# Patient Record
Sex: Female | Born: 1935 | Race: White | Hispanic: No | Smoking: Former smoker
Health system: Southern US, Community
[De-identification: ages and names within clinical notes are randomized; demographics above are authoritative.]

## PROBLEM LIST (undated history)

## (undated) ENCOUNTER — Emergency Department (HOSPITAL_COMMUNITY): Payer: Medicare Other

## (undated) DIAGNOSIS — E049 Nontoxic goiter, unspecified: Secondary | ICD-10-CM

## (undated) DIAGNOSIS — E039 Hypothyroidism, unspecified: Secondary | ICD-10-CM

## (undated) DIAGNOSIS — M810 Age-related osteoporosis without current pathological fracture: Secondary | ICD-10-CM

## (undated) DIAGNOSIS — I459 Conduction disorder, unspecified: Secondary | ICD-10-CM

## (undated) HISTORY — DX: Age-related osteoporosis without current pathological fracture: M81.0

## (undated) HISTORY — DX: Nontoxic goiter, unspecified: E04.9

## (undated) HISTORY — DX: Conduction disorder, unspecified: I45.9

## (undated) HISTORY — PX: THYROID SURGERY: SHX805

## (undated) HISTORY — DX: Hypothyroidism, unspecified: E03.9

---

## 2013-01-16 ENCOUNTER — Ambulatory Visit (HOSPITAL_COMMUNITY)
Admission: RE | Admit: 2013-01-16 | Discharge: 2013-01-16 | Disposition: A | Discharge status: Home / Self Care | Payer: Medicare Other | Source: Ambulatory Visit | Attending: Neurology | Admitting: Neurology

## 2013-01-16 ENCOUNTER — Other Ambulatory Visit: Payer: Self-pay | Admitting: Neurology

## 2013-01-16 DIAGNOSIS — M545 Low back pain, unspecified: Secondary | ICD-10-CM | POA: Insufficient documentation

## 2013-01-16 DIAGNOSIS — M79609 Pain in unspecified limb: Secondary | ICD-10-CM | POA: Insufficient documentation

## 2013-01-16 DIAGNOSIS — M5416 Radiculopathy, lumbar region: Secondary | ICD-10-CM

## 2013-01-16 DIAGNOSIS — M4120 Other idiopathic scoliosis, site unspecified: Secondary | ICD-10-CM | POA: Insufficient documentation

## 2013-04-25 ENCOUNTER — Other Ambulatory Visit (HOSPITAL_COMMUNITY): Payer: Self-pay | Admitting: Urology

## 2013-04-25 DIAGNOSIS — R3129 Other microscopic hematuria: Secondary | ICD-10-CM

## 2013-05-01 ENCOUNTER — Ambulatory Visit (HOSPITAL_COMMUNITY)
Admission: RE | Admit: 2013-05-01 | Discharge: 2013-05-01 | Disposition: A | Discharge status: Home / Self Care | Payer: Medicare Other | Source: Ambulatory Visit | Attending: Urology | Admitting: Urology

## 2013-05-01 DIAGNOSIS — N200 Calculus of kidney: Secondary | ICD-10-CM | POA: Insufficient documentation

## 2013-05-01 DIAGNOSIS — R3129 Other microscopic hematuria: Secondary | ICD-10-CM | POA: Insufficient documentation

## 2013-05-01 DIAGNOSIS — R9389 Abnormal findings on diagnostic imaging of other specified body structures: Secondary | ICD-10-CM | POA: Insufficient documentation

## 2013-05-01 LAB — POCT I-STAT, CHEM 8
Glucose, Bld: 77 mg/dL (ref 70–99)
HCT: 40 % (ref 36.0–46.0)
Hemoglobin: 13.6 g/dL (ref 12.0–15.0)
Potassium: 3.6 mEq/L (ref 3.5–5.1)
Sodium: 140 mEq/L (ref 135–145)

## 2013-05-01 MED ORDER — IOHEXOL 300 MG/ML  SOLN
125.0000 mL | Freq: Once | INTRAMUSCULAR | Status: AC | PRN
  Administered 2013-05-01: 125 mL via INTRAVENOUS

## 2013-05-01 NOTE — Progress Notes (Signed)
Blood sample obtained from left arm IV for Creatnine level.  

## 2014-05-31 DIAGNOSIS — K219 Gastro-esophageal reflux disease without esophagitis: Secondary | ICD-10-CM | POA: Insufficient documentation

## 2014-05-31 DIAGNOSIS — R079 Chest pain, unspecified: Secondary | ICD-10-CM | POA: Insufficient documentation

## 2014-05-31 DIAGNOSIS — E876 Hypokalemia: Secondary | ICD-10-CM | POA: Insufficient documentation

## 2014-09-25 ENCOUNTER — Ambulatory Visit (HOSPITAL_COMMUNITY)
Admission: RE | Admit: 2014-09-25 | Discharge: 2014-09-25 | Disposition: A | Discharge status: Home / Self Care | Payer: Medicare HMO | Source: Ambulatory Visit | Attending: Urology | Admitting: Urology

## 2014-09-25 ENCOUNTER — Other Ambulatory Visit (HOSPITAL_COMMUNITY): Payer: Self-pay | Admitting: Urology

## 2014-09-25 DIAGNOSIS — R3129 Other microscopic hematuria: Secondary | ICD-10-CM

## 2014-09-25 DIAGNOSIS — M549 Dorsalgia, unspecified: Secondary | ICD-10-CM | POA: Insufficient documentation

## 2014-09-25 DIAGNOSIS — R109 Unspecified abdominal pain: Secondary | ICD-10-CM | POA: Diagnosis present

## 2014-09-25 DIAGNOSIS — R312 Other microscopic hematuria: Secondary | ICD-10-CM | POA: Insufficient documentation

## 2014-09-25 DIAGNOSIS — N200 Calculus of kidney: Secondary | ICD-10-CM

## 2015-09-01 ENCOUNTER — Ambulatory Visit (INDEPENDENT_AMBULATORY_CARE_PROVIDER_SITE_OTHER): Payer: Medicare Other | Admitting: "Endocrinology

## 2015-09-01 ENCOUNTER — Encounter: Payer: Self-pay | Admitting: "Endocrinology

## 2015-09-01 VITALS — BP 148/76 | HR 67 | Ht <= 58 in | Wt 95.0 lb

## 2015-09-01 DIAGNOSIS — E039 Hypothyroidism, unspecified: Secondary | ICD-10-CM

## 2015-09-01 MED ORDER — LEVOTHYROXINE SODIUM 75 MCG PO TABS: 75.0000 ug | ORAL_TABLET | Freq: Every day | ORAL | Status: DC

## 2015-09-01 NOTE — Progress Notes (Signed)
Subjective:    Patient ID: Laurie Humphrey, female    DOB: [birth year],    Past Medical History  Diagnosis Date  . Goiter, non-toxic   . Osteoporosis   . Hypothyroidism    Past Surgical History  Procedure Laterality Date  . Thyroid surgery     Social History   Social History  . Marital Status: Widowed    Spouse Name: N/A  . Number of Children: N/A  . Years of Education: N/A   Social History Main Topics  . Smoking status: Current Some Day Smoker  . Smokeless tobacco: None  . Alcohol Use: None  . Drug Use: None  . Sexual Activity: Not Asked   Other Topics Concern  . None   Social History Narrative  . None   Outpatient Encounter Prescriptions as of 09/01/2015  Medication Sig  . ALPRAZolam (XANAX) 0.5 MG tablet Take 0.5 mg by mouth at bedtime as needed for anxiety.  Marland Kitchen levothyroxine (SYNTHROID, LEVOTHROID) 75 MCG tablet Take 1 tablet (75 mcg total) by mouth daily before breakfast.  . omeprazole (PRILOSEC) 20 MG capsule Take 20 mg by mouth daily.  . [DISCONTINUED] levothyroxine (SYNTHROID, LEVOTHROID) 75 MCG tablet Take 75 mcg by mouth daily before breakfast.   No facility-administered encounter medications on file as of 09/01/2015.   ALLERGIES: Allergies  Allergen Reactions  . Sulfa Antibiotics    VACCINATION STATUS:  There is no immunization history on file for this patient.  HPI  Laurie Humphrey is 81-yr-old female with medical hx as above. she is here to f/u with repeat TFTs. Her thyroud u/s report from [facility] reviwed.  Her hx starts from 1970 when she underwent thyroidectomy for a "benign disease". she took thyroid hormone at various doses ever since , currently at 75 mcg po qam. she denies new complaints.  she is compliant to her medications. she said that when she was tried on 50 mcg of synthroid, she "could not move her legs'. she has steady weight. denies heat /cold intolerance.  Review of Systems  Constitutional: Positive for fatigue.  Negative for unexpected weight change.  HENT: Negative for trouble swallowing and voice change.   Eyes: Negative for visual disturbance.  Respiratory: Negative for cough, shortness of breath and wheezing.   Cardiovascular: Negative for chest pain, palpitations and leg swelling.  Gastrointestinal: Negative for nausea, vomiting and diarrhea.  Endocrine: Negative for cold intolerance, heat intolerance, polydipsia, polyphagia and polyuria.  Musculoskeletal: Negative for myalgias and arthralgias.  Skin: Negative for color change, pallor, rash and wound.  Neurological: Negative for seizures and headaches.  Psychiatric/Behavioral: Negative for suicidal ideas and confusion.    Objective:    BP 148/76 mmHg  Pulse 67  Ht  (1.473 m)  Wt 95 lb (43.092 kg)  BMI 19.86 kg/m2  SpO2 99%  Wt Readings from Last 3 Encounters:  09/01/15 95 lb (43.092 kg)    Physical Exam  Constitutional: She is oriented to person, place, and time. She appears well-developed.  HENT:  Head: Normocephalic and atraumatic.  Eyes: EOM are normal.  Neck: Normal range of motion. Neck supple. No tracheal deviation present. No thyromegaly present.  Cardiovascular: Normal rate and regular rhythm.   Pulmonary/Chest: Effort normal and breath sounds normal.  Abdominal: Soft. Bowel sounds are normal. There is no tenderness. There is no guarding.  Musculoskeletal: Normal range of motion. She exhibits no edema.  Neurological: She is alert and oriented to person, place, and time. She has normal reflexes. No  cranial nerve deficit. Coordination normal.  Hard of hearing  Skin: Skin is warm and dry. No rash noted. No erythema. No pallor.  Psychiatric: She has a normal mood and affect. Judgment normal.    Results for orders placed or performed during the hospital encounter of 05/01/13  I-STAT, chem 8  Result Value Ref Range   Sodium 140 135 - 145 mEq/L   Potassium 3.6 3.5 - 5.1 mEq/L   Chloride 105 96 - 112 mEq/L   BUN 16 6  - 23 mg/dL   Creatinine, Ser 1.61 0.50 - 1.10 mg/dL   Glucose, Bld 77 70 - 99 mg/dL   Calcium, Ion 0.96 0.45 - 1.30 mmol/L   TCO2 28 0 - 100 mmol/L   Hemoglobin 13.6 12.0 - 15.0 g/dL   HCT 40.9 81.1 - 91.4 %   Complete Blood Count (Most recent): Lab Results  Component Value Date   HGB 13.6 05/01/2013   HCT 40.0 05/01/2013   Chemistry (most recent): Lab Results  Component Value Date   NA 140 05/01/2013   K 3.6 05/01/2013   CL 105 05/01/2013   BUN 16 05/01/2013   CREATININE 1.10 05/01/2013   Diabetic Labs (most recent): No results found for: HGBA1C Lipid profile (most recent): No results found for: TRIG, CHOL       Assessment & Plan:   1. Hypothyroidism, unspecified hypothyroidism type - She is euthyroid. Her free t4 is high normal at 1.8 along with TSH of 0.35 slightly suppressed. -these  TFTs are suggestive of a slight over-replacement. -However, given the fact that she did not do well on lower doses of thyroid hormone and the upcoming cold season I decided to keep her on her current dose of Synthroid 75 mcg po qam for now. Dose adjustment will be made based on her next thyroid function tests. -her thyroid ultrasound report records from [facility] showing remnants of thyroid in the bed: rt lobe 1.4 x 0.6 cms x 0.5 cms and left lobe 1.5 cms x 1cms x 1cm. no nodules nor masses. she will not need any further work up nor intervention for this.  she will RTN in 6 months with TFTs for reevaluation. - T4, free - TSH   I advised patient to maintain close follow up with their PCP for primary care needs. Follow up plan: Return in about 6 months (around 03/01/2016) for underactive thyroid.  Marquis Lunch, MD Phone: [phone]  Fax: [phone]   09/01/2015, 1:57 PM

## 2016-02-23 ENCOUNTER — Other Ambulatory Visit: Payer: Self-pay | Admitting: "Endocrinology

## 2016-02-23 LAB — TSH: TSH: 0.98 m[IU]/L

## 2016-02-23 LAB — T4, FREE: FREE T4: 1.5 ng/dL (ref 0.8–1.8)

## 2016-03-01 ENCOUNTER — Encounter: Payer: Self-pay | Admitting: "Endocrinology

## 2016-03-01 ENCOUNTER — Ambulatory Visit (INDEPENDENT_AMBULATORY_CARE_PROVIDER_SITE_OTHER): Payer: Medicare Other | Admitting: "Endocrinology

## 2016-03-01 VITALS — BP 125/80 | HR 84 | Ht <= 58 in | Wt 91.0 lb

## 2016-03-01 DIAGNOSIS — E039 Hypothyroidism, unspecified: Secondary | ICD-10-CM

## 2016-03-01 MED ORDER — LEVOTHYROXINE SODIUM 75 MCG PO TABS: 75.0000 ug | ORAL_TABLET | Freq: Every day | ORAL | Status: AC

## 2016-03-01 NOTE — Progress Notes (Signed)
Subjective:    Patient ID: Laurie Humphrey, female    DOB: [birth year],    Past Medical History  Diagnosis Date  . Goiter, non-toxic   . Osteoporosis   . Hypothyroidism    Past Surgical History  Procedure Laterality Date  . Thyroid surgery     Social History   Social History  . Marital Status: Widowed    Spouse Name: N/A  . Number of Children: N/A  . Years of Education: N/A   Social History Main Topics  . Smoking status: Current Some Day Smoker  . Smokeless tobacco: None  . Alcohol Use: None  . Drug Use: None  . Sexual Activity: Not Asked   Other Topics Concern  . None   Social History Narrative   Outpatient Encounter Prescriptions as of 03/01/2016  Medication Sig  . ALPRAZolam (XANAX) 0.5 MG tablet Take 0.5 mg by mouth at bedtime as needed for anxiety.  Marland Kitchen levothyroxine (SYNTHROID, LEVOTHROID) 75 MCG tablet Take 1 tablet (75 mcg total) by mouth daily before breakfast.  . omeprazole (PRILOSEC) 20 MG capsule Take 20 mg by mouth daily.  . [DISCONTINUED] levothyroxine (SYNTHROID, LEVOTHROID) 75 MCG tablet Take 1 tablet (75 mcg total) by mouth daily before breakfast.   No facility-administered encounter medications on file as of 03/01/2016.   ALLERGIES: Allergies  Allergen Reactions  . Sulfa Antibiotics    VACCINATION STATUS:  There is no immunization history on file for this patient.  HPI  Laurie Humphrey is 82-yr-old female with medical hx as above. she is here to f/u with repeat TFTs. Her thyroud u/s report from [facility] reviwed.  Her hx starts from 1970 when she underwent thyroidectomy for a "benign disease". she took thyroid hormone at various doses ever since , currently at 75 mcg po qam. she denies new complaints.  she is compliant to her medications. she said that when she was tried on 50 mcg of synthroid, she "could not move her legs'. she has  Lost some  Weight, mainly due to the fact that she does not eat enough food because she states she can't  taste her food anymore. denies heat /cold intolerance.  Review of Systems  Constitutional: Positive for fatigue. Negative for unexpected weight change.  HENT: Negative for trouble swallowing and voice change.   Eyes: Negative for visual disturbance.  Respiratory: Negative for cough, shortness of breath and wheezing.   Cardiovascular: Negative for chest pain, palpitations and leg swelling.  Gastrointestinal: Negative for nausea, vomiting and diarrhea.  Endocrine: Negative for cold intolerance, heat intolerance, polydipsia, polyphagia and polyuria.  Musculoskeletal: Negative for myalgias and arthralgias.  Skin: Negative for color change, pallor, rash and wound.  Neurological: Negative for seizures and headaches.  Psychiatric/Behavioral: Negative for suicidal ideas and confusion.    Objective:    BP 125/80 mmHg  Pulse 84  Ht  (1.473 m)  Wt 91 lb (41.277 kg)  BMI 19.02 kg/m2  SpO2 98%  Wt Readings from Last 3 Encounters:  03/01/16 91 lb (41.277 kg)  09/01/15 95 lb (43.092 kg)    Physical Exam  Constitutional: She is oriented to person, place, and time. She appears well-developed.  HENT:  Head: Normocephalic and atraumatic.  Eyes: EOM are normal.  Neck: Normal range of motion. Neck supple. No tracheal deviation present. No thyromegaly present.  Cardiovascular: Normal rate and regular rhythm.   Pulmonary/Chest: Effort normal and breath sounds normal.  Abdominal: Soft. Bowel sounds are normal. There is no tenderness. There  is no guarding.  Musculoskeletal: Normal range of motion. She exhibits no edema.  Neurological: She is alert and oriented to person, place, and time. She has normal reflexes. No cranial nerve deficit. Coordination normal.  Hard of hearing  Skin: Skin is warm and dry. No rash noted. No erythema. No pallor.  Psychiatric: She has a normal mood and affect. Judgment normal.    Results for orders placed or performed in visit on 02/23/16  TSH  Result Value Ref  Range   TSH 0.98 mIU/L  T4, free  Result Value Ref Range   Free T4 1.5 0.8 - 1.8 ng/dL   Complete Blood Count (Most recent): Lab Results  Component Value Date   HGB 13.6 05/01/2013   HCT 40.0 05/01/2013   Chemistry (most recent): Lab Results  Component Value Date   NA 140 05/01/2013   K 3.6 05/01/2013   CL 105 05/01/2013   BUN 16 05/01/2013   CREATININE 1.10 05/01/2013     Results for Laurie MerryCARTER, Kayloni M (MRN [id number]) as of 03/01/2016 11:06  Ref. Range 02/23/2016 11:59  TSH Latest Units: mIU/L 0.98  T4,Free(Direct) Latest Ref Range: 0.8-1.8 ng/dL 1.5    Assessment & Plan:   1. Hypothyroidism, unspecified hypothyroidism type - Heart thyroid function tests are consistent with appropriate replacement for now.  -I will continue levothyroxine 75 g by mouth every morning.  - We discussed about correct intake of levothyroxine, at fasting, with water, separated by at least 30 minutes from breakfast, and separated by more than 4 hours from calcium, iron, multivitamins, acid reflux medications (PPIs). -Patient is made aware of the fact that thyroid hormone replacement is needed for life, dose to be adjusted by periodic monitoring of thyroid function tests.  -her thyroid ultrasound report records [facility] showing remnants of thyroid in the bed: rt lobe 1.4 x 0.6 cms x 0.5 cms and left lobe 1.5 cms x 1cms x 1cm. no nodules nor masses. she will not need any further work up nor intervention for this. - Due to unintended weight loss mainly from undernourishment, I have encouraged her to eat more carbs of her choice. She says she likes potatoes and I encouraged her to eat more potatoes and less lemonade and sugary beverages.  she will RTN in 6 months with TFTs for reevaluation.  I advised patient to maintain close follow up with their PCP for primary care needs. Follow up plan: Return in about 6 months (around 08/31/2016) for underactive thyroid, follow up with pre-visit  labs.  Marquis LunchGebre Madalee Altmann, MD Phone: [phone]  Fax: [phone]   03/01/2016, 11:12 AM

## 2016-07-23 HISTORY — PX: PACEMAKER IMPLANT: EP1218

## 2016-08-11 ENCOUNTER — Ambulatory Visit: Payer: Medicare HMO | Admitting: Cardiology

## 2016-08-11 ENCOUNTER — Ambulatory Visit: Payer: Medicare HMO | Admitting: Cardiovascular Disease

## 2016-08-13 DIAGNOSIS — I442 Atrioventricular block, complete: Secondary | ICD-10-CM | POA: Insufficient documentation

## 2016-09-01 ENCOUNTER — Ambulatory Visit: Payer: 59 | Admitting: "Endocrinology

## 2017-05-27 ENCOUNTER — Ambulatory Visit (INDEPENDENT_AMBULATORY_CARE_PROVIDER_SITE_OTHER): Payer: Medicare Other | Admitting: Gastroenterology

## 2017-05-27 ENCOUNTER — Encounter: Payer: Self-pay | Admitting: Gastroenterology

## 2017-05-27 ENCOUNTER — Other Ambulatory Visit: Payer: Self-pay

## 2017-05-27 DIAGNOSIS — R1012 Left upper quadrant pain: Secondary | ICD-10-CM | POA: Insufficient documentation

## 2017-05-27 MED ORDER — SUCRALFATE 1 GM/10ML PO SUSP: 1.0000 g | Freq: Four times a day (QID) | ORAL | 1 refills | Status: DC

## 2017-05-27 NOTE — Progress Notes (Signed)
Primary Care Physician:  Cathren Laine, MD Primary Gastroenterologist:  Dr. Oneida Alar   Chief Complaint  Patient presents with  . Abdominal Pain    left side-US done; had been hurting in lower abd  . Gastroesophageal Reflux    referral was for gerd?; ok now    HPI:   Laurie Humphrey is a 83 y.o. female presenting today at the request of her PCP with abdominal pain. Referral information notes reason for consult "GERD". Outside labs from May 2018 with Hgb 13.1, no leukocytosis, platelets 275, BUN 13, Cr 1.1, Tbili 0.6, Alk Phos 58, AST 19, ALT 13, Albumin 4.8, US abdomen from May 2018 with normal liver, gallbladder, CBD. It appears a CT was done, but I am unable to read the findings due to print quality, but it appears impression states "no acute findings".   Abdominal pain in left-side started a month ago. Some days no pain, some days "all the time". Yesterday so bad she was in tears. When walking, she has pain. No exacerbation of pain after eating. However, she does sometimes get nauseated after eating. No weight loss. Doesn't like to go without eating. Unsure if she has ever had black, tarry stool as "I don't ever look". No NSAIDs or aspirin powders. Some confusion regarding Norco, as PCP prescribed this but there was no improvement after taking. She has taken all of this as of today. No dysphagia. No constipation or diarrhea.   States when she was in her 89s, she was in the hospital for about 17 days for "colitis" and then when she went back it was "healed up" and no signs of colitis. Last colonoscopy in 2006 at [facility].   History of complete heart block, with pacemaker implanted Sept 2017.   Past Medical History:  Diagnosis Date  . Goiter, non-toxic   . Heart block    Pacemaker sept 2017  . Hypothyroidism   . Osteoporosis     Past Surgical History:  Procedure Laterality Date  . PACEMAKER IMPLANT  07/2016   [facility]  . THYROID SURGERY      Current Outpatient Prescriptions    Medication Sig Dispense Refill  . ALPRAZolam (XANAX) 0.5 MG tablet Take 0.5 mg by mouth at bedtime as needed for anxiety.    Marland Kitchen levothyroxine (SYNTHROID, LEVOTHROID) 75 MCG tablet Take 1 tablet (75 mcg total) by mouth daily before breakfast. 30 tablet 6  . omeprazole (PRILOSEC) 20 MG capsule Take 20 mg by mouth daily.     No current facility-administered medications for this visit.     Allergies as of 05/27/2017 - Review Complete 05/27/2017  Allergen Reaction Noted  . Sulfa antibiotics  09/01/2015    Family History  Problem Relation Age of Onset  . Diabetes Mother   . Colon polyps Daughter   . Colon cancer Neg Hx     Social History   Social History  . Marital status: Widowed    Spouse name: N/A  . Number of children: N/A  . Years of education: N/A   Occupational History  . Not on file.   Social History Main Topics  . Smoking status: Former Research scientist (life sciences)  . Smokeless tobacco: Never Used  . Alcohol use No  . Drug use: No  . Sexual activity: Not on file   Other Topics Concern  . Not on file   Social History Narrative  . No narrative on file    Review of Systems: As mentioned in HPI   Physical Exam: BP Marland Kitchen)  166/85   Pulse 71   Temp (!) 96.7 F (35.9 C) (Oral)   Ht 4' 10"  (1.473 m)   Wt 93 lb 9.6 oz (42.5 kg)   BMI 19.56 kg/m  General:   Alert and oriented. Pleasant and cooperative. Well-nourished and well-developed.  Head:  Normocephalic and atraumatic. Eyes:  Without icterus, sclera clear and conjunctiva pink.  Ears:  Normal auditory acuity. Nose:  No deformity, discharge,  or lesions. Mouth:  No deformity or lesions, oral mucosa pink.  Lungs:  Clear to auscultation bilaterally. No wheezes, rales, or rhonchi. No distress.  Heart:  S1, S2 present without murmurs appreciated.  Abdomen:  +BS, soft, TTP below left rib margin, epigastric, and non-distended. No HSM noted. No guarding or rebound. No masses appreciated.  Rectal:  Deferred  Msk:  With kyphosis   Extremities:  Without  edema. Neurologic:  Alert and  oriented x4 Psych:  Alert and cooperative. Normal mood and affect.

## 2017-05-27 NOTE — Patient Instructions (Signed)
Continue Prilosec once each morning, 30 minutes before breakfast.  I sent in a suspension to take 4 times a day. If this is not helpful, you can stop taking after a week.   We have scheduled you for an upper endoscopy with Dr. Darrick PennaFields in the near future.

## 2017-05-28 NOTE — Assessment & Plan Note (Addendum)
83 year old with new onset of LUQ abdominal pain, vague in nature, denying NSAIDs or aspirin powders. US abdomen May 2018 by PCP without acute findings. CT completed and difficult to read due to print quality from fax, but I note it states "no acute findings". I have requested this again. CBC and LFTs unrevealing. No lower GI symptoms. Discussed endoscopic evaluation in near future, continuing Prilosec daily, and requesting CT findings again. Patient aware. Signs/symptoms that would necessitate urgent evaluation discussed. As of note, she has had no weight loss, symptoms do not seem worse or better with eating, and her presentation and symptoms do not seem consistent with an ischemic etiology.   Proceed with upper endoscopy in the near future with Dr. Darrick PennaFields. The risks, benefits, and alternatives have been discussed in detail with patient. They have stated understanding and desire to proceed.  As of note, patient has pacemaker, implanted Sept 2017

## 2017-05-29 NOTE — Progress Notes (Signed)
REVIEWED-NO ADDITIONAL RECOMMENDATIONS. 

## 2017-05-30 NOTE — Progress Notes (Signed)
CC'ED TO PCP 

## 2017-06-07 ENCOUNTER — Encounter (HOSPITAL_COMMUNITY)
Admission: RE | Disposition: A | Discharge status: Home / Self Care | Payer: Self-pay | Source: Ambulatory Visit | Attending: Gastroenterology

## 2017-06-07 ENCOUNTER — Encounter (HOSPITAL_COMMUNITY): Payer: Self-pay | Admitting: Gastroenterology

## 2017-06-07 ENCOUNTER — Ambulatory Visit (HOSPITAL_COMMUNITY)
Admission: RE | Admit: 2017-06-07 | Discharge: 2017-06-07 | Disposition: A | Discharge status: Home / Self Care | Payer: Medicare Other | Source: Ambulatory Visit | Attending: Gastroenterology | Admitting: Gastroenterology

## 2017-06-07 DIAGNOSIS — Z79899 Other long term (current) drug therapy: Secondary | ICD-10-CM | POA: Diagnosis not present

## 2017-06-07 DIAGNOSIS — Z882 Allergy status to sulfonamides status: Secondary | ICD-10-CM | POA: Diagnosis not present

## 2017-06-07 DIAGNOSIS — R1012 Left upper quadrant pain: Secondary | ICD-10-CM | POA: Diagnosis not present

## 2017-06-07 DIAGNOSIS — I459 Conduction disorder, unspecified: Secondary | ICD-10-CM | POA: Insufficient documentation

## 2017-06-07 DIAGNOSIS — K2950 Unspecified chronic gastritis without bleeding: Secondary | ICD-10-CM | POA: Diagnosis not present

## 2017-06-07 DIAGNOSIS — Z8371 Family history of colonic polyps: Secondary | ICD-10-CM | POA: Insufficient documentation

## 2017-06-07 DIAGNOSIS — K449 Diaphragmatic hernia without obstruction or gangrene: Secondary | ICD-10-CM | POA: Diagnosis not present

## 2017-06-07 DIAGNOSIS — Z950 Presence of cardiac pacemaker: Secondary | ICD-10-CM | POA: Diagnosis not present

## 2017-06-07 DIAGNOSIS — Z87891 Personal history of nicotine dependence: Secondary | ICD-10-CM | POA: Insufficient documentation

## 2017-06-07 DIAGNOSIS — R1013 Epigastric pain: Secondary | ICD-10-CM

## 2017-06-07 DIAGNOSIS — K2970 Gastritis, unspecified, without bleeding: Secondary | ICD-10-CM

## 2017-06-07 DIAGNOSIS — E039 Hypothyroidism, unspecified: Secondary | ICD-10-CM | POA: Insufficient documentation

## 2017-06-07 DIAGNOSIS — M810 Age-related osteoporosis without current pathological fracture: Secondary | ICD-10-CM | POA: Diagnosis not present

## 2017-06-07 HISTORY — PX: ESOPHAGOGASTRODUODENOSCOPY: SHX5428

## 2017-06-07 HISTORY — PX: BIOPSY: SHX5522

## 2017-06-07 SURGERY — EGD (ESOPHAGOGASTRODUODENOSCOPY)
Anesthesia: Moderate Sedation

## 2017-06-07 MED ORDER — MEPERIDINE HCL 100 MG/ML IJ SOLN
INTRAMUSCULAR | Status: AC
  Filled 2017-06-07: qty 2

## 2017-06-07 MED ORDER — MIDAZOLAM HCL 5 MG/5ML IJ SOLN
INTRAMUSCULAR | Status: DC | PRN
  Administered 2017-06-07: 2 mg via INTRAVENOUS

## 2017-06-07 MED ORDER — LIDOCAINE VISCOUS 2 % MT SOLN
OROMUCOSAL | Status: AC
  Filled 2017-06-07: qty 15

## 2017-06-07 MED ORDER — SIMETHICONE 40 MG/0.6ML PO SUSP
ORAL | Status: DC | PRN
  Administered 2017-06-07: 14:00:00

## 2017-06-07 MED ORDER — MIDAZOLAM HCL 5 MG/5ML IJ SOLN
INTRAMUSCULAR | Status: AC
  Filled 2017-06-07: qty 10

## 2017-06-07 MED ORDER — SODIUM CHLORIDE 0.9 % IV SOLN
INTRAVENOUS | Status: DC
  Administered 2017-06-07: 14:00:00 via INTRAVENOUS

## 2017-06-07 MED ORDER — MEPERIDINE HCL 100 MG/ML IJ SOLN
INTRAMUSCULAR | Status: DC | PRN
  Administered 2017-06-07: 25 mg via INTRAVENOUS

## 2017-06-07 MED ORDER — LIDOCAINE VISCOUS 2 % MT SOLN
OROMUCOSAL | Status: DC | PRN
  Administered 2017-06-07: 3 mL via OROMUCOSAL

## 2017-06-07 NOTE — H&P (Signed)
  Primary Care Physician:  Lindajo RoyalSanders, Kirk, MD Primary Gastroenterologist:  Dr. Darrick PennaFields  Pre-Procedure History & Physical: HPI:  Laurie Humphrey is a 83 y.o. female here for ABDOMINAL PAIN/DYSPEPSIA.  Past Medical History:  Diagnosis Date  . Goiter, non-toxic   . Heart block    Pacemaker sept 2017  . Hypothyroidism   . Osteoporosis     Past Surgical History:  Procedure Laterality Date  . PACEMAKER IMPLANT  07/2016   [facility]  . THYROID SURGERY      Prior to Admission medications   Medication Sig Start Date End Date Taking? Authorizing Provider  ALPRAZolam Prudy Feeler(XANAX) 0.5 MG tablet Take 0.5 mg by mouth at bedtime.    Yes [provider]  cholecalciferol (VITAMIN D) 1000 units tablet Take 1,000 Units by mouth daily.   Yes [provider]  folic acid (FOLVITE) 1 MG tablet Take 1 mg by mouth daily.   Yes [provider]  levothyroxine (SYNTHROID, LEVOTHROID) 75 MCG tablet Take 1 tablet (75 mcg total) by mouth daily before breakfast. 03/01/16  Yes Nida, Denman GeorgeGebreselassie W, MD  omeprazole (PRILOSEC) 20 MG capsule Take 20 mg by mouth daily.   Yes [provider]  vitamin B-12 (CYANOCOBALAMIN) 1000 MCG tablet Take 1,000 mcg by mouth daily.   Yes [provider]  sucralfate (CARAFATE) 1 GM/10ML suspension Take 10 mLs (1 g total) by mouth 4 (four) times daily. Patient not taking: Reported on 06/03/2017 05/27/17   Gelene MinkBoone, Anna W, NP    Allergies as of 05/27/2017 - Review Complete 05/27/2017  Allergen Reaction Noted  . Sulfa antibiotics  09/01/2015    Family History  Problem Relation Age of Onset  . Diabetes Mother   . Colon polyps Daughter   . Colon cancer Neg Hx     Social History   Social History  . Marital status: Widowed    Spouse name: N/A  . Number of children: N/A  . Years of education: N/A   Occupational History  . Not on file.   Social History Main Topics  . Smoking status: Former Games developermoker  . Smokeless tobacco: Never Used  . Alcohol  use No  . Drug use: No  . Sexual activity: Not on file   Other Topics Concern  . Not on file   Social History Narrative  . No narrative on file    Review of Systems: See HPI, otherwise negative ROS   Physical Exam: BP 125/69   Pulse 66   Temp 97.9 F (36.6 C) (Oral)   Resp 19   Ht 4\' 10"  (1.473 m)   Wt 93 lb (42.2 kg)   SpO2 100%   BMI 19.44 kg/m  General:   Alert,  pleasant and cooperative in NAD Head:  Normocephalic and atraumatic. Neck:  Supple; Lungs:  Clear throughout to auscultation.    Heart:  Regular rate and rhythm. Abdomen:  Soft, nontender and nondistended. Normal bowel sounds, without guarding, and without rebound.   Neurologic:  Alert and  oriented x4;  grossly normal neurologically.  Impression/Plan:     ABDOMINAL PAIN/DYSPEPSIA.  PLAN: 1. EGD TODAY. DISCUSSED PROCEDURE, BENEFITS, & RISKS: < 1% chance of medication reaction, bleeding, or perforation.

## 2017-06-07 NOTE — Discharge Instructions (Addendum)
You have mild gastritis. I biopsied your stomach.   TAKE OMEPRAZOLE 30 MINUTES PRIOR TO first MEAL DAILY.  YOUR BIOPSY RESULTS WILL BE AVAILABLE IN MY CHART AFTER JUL 20 AND MY OFFICE WILL CONTACT YOU IN 10-14 DAYS WITH YOUR RESULTS.   FOLLOW UP IN 2 MOS.  UPPER ENDOSCOPY AFTER CARE Read the instructions outlined below and refer to this sheet in the next week. These discharge instructions provide you with general information on caring for yourself after you leave the hospital. While your treatment has been planned according to the most current medical practices available, unavoidable complications occasionally occur. If you have any problems or questions after discharge, call DR. Alaia Lordi, [phone].  ACTIVITY  You may resume your regular activity, but move at a slower pace for the next 24 hours.   Take frequent rest periods for the next 24 hours.   Walking will help get rid of the air and reduce the bloated feeling in your belly (abdomen).   No driving for 24 hours (because of the medicine (anesthesia) used during the test).   You may shower.   Do not sign any important legal documents or operate any machinery for 24 hours (because of the anesthesia used during the test).    NUTRITION  Drink plenty of fluids.   You may resume your normal diet as instructed by your doctor.   Begin with a light meal and progress to your normal diet. Heavy or fried foods are harder to digest and may make you feel sick to your stomach (nauseated).   Avoid alcoholic beverages for 24 hours or as instructed.    MEDICATIONS  You may resume your normal medications.   WHAT YOU CAN EXPECT TODAY  Some feelings of bloating in the abdomen.   Passage of more gas than usual.    IF YOU HAD A BIOPSY TAKEN DURING THE UPPER ENDOSCOPY:  Eat a soft diet IF YOU HAVE NAUSEA, BLOATING, ABDOMINAL PAIN, OR VOMITING.    FINDING OUT THE RESULTS OF YOUR TEST Not all test results are available during  your visit. DR. Darrick PennaFIELDS WILL CALL YOU WITHIN 14 DAYS OF YOUR PROCEDUE WITH YOUR RESULTS. Do not assume everything is normal if you have not heard from DR. Freddie Nghiem, CALL HER OFFICE AT [phone].  SEEK IMMEDIATE MEDICAL ATTENTION AND CALL THE OFFICE: [phone] IF:  You have more than a spotting of blood in your stool.   Your belly is swollen (abdominal distention).   You are nauseated or vomiting.   You have a temperature over 101F.   You have abdominal pain or discomfort that is severe or gets worse throughout the day.   Gastritis  Gastritis is an inflammation (the body's way of reacting to injury and/or infection) of the stomach. It is often caused by viral or bacterial (germ) infections. It can also be caused BY ASPIRIN, BC/GOODY POWDER'S, (IBUPROFEN) MOTRIN, OR ALEVE (NAPROXEN), chemicals (including alcohol), SPICY FOODS, and medications. This illness may be associated with generalized malaise (feeling tired, not well), UPPER ABDOMINAL STOMACH cramps, and fever. One common bacterial cause of gastritis is an organism known as H. Pylori. This can be treated with antibiotics.

## 2017-06-07 NOTE — Op Note (Signed)
[facility] Patient Name: Laurie Humphrey Procedure Date: 06/07/2017 1:06 PM MRN: [id number] Date of Birth: 09-Apr-1929 Attending MD: Jonette Eva , MD CSN: [id number] Age: 83 Admit Type: Outpatient Procedure:                Upper GI endoscopy WITH COLD FORCEPS BIOPSY Indications:              Abdominal pain in the left upper quadrant,                            Dyspepsia. THINKS SHE HAS A KIDNEY STONE. LOST                            TASTE > 5 YRS AGO WEIGHT STABLE 93-95 LBS SINCE                            2016. Providers:                Jonette Eva, MD, Criselda Peaches. Patsy Lager, RN, Dyann Ruddle Referring MD:             Lindajo Royal, MD Medicines:                Meperidine 25 mg IV, Midazolam 2 mg IV Complications:            No immediate complications. Estimated Blood Loss:     Estimated blood loss was minimal. Procedure:                Pre-Anesthesia Assessment:                           - Prior to the procedure, a History and Physical                            was performed, and patient medications and                            allergies were reviewed. The patient's tolerance of                            previous anesthesia was also reviewed. The risks                            and benefits of the procedure and the sedation                            options and risks were discussed with the patient.                            All questions were answered, and informed consent                            was obtained. Prior Anticoagulants: The patient has  taken no previous anticoagulant or antiplatelet                            agents. ASA Grade Assessment: II - A patient with                            mild systemic disease. After reviewing the risks                            and benefits, the patient was deemed in                            satisfactory condition to undergo the procedure.                            After  obtaining informed consent, the endoscope was                            passed under direct vision. Throughout the                            procedure, the patient's blood pressure, pulse, and                            oxygen saturations were monitored continuously. The                            [id number] ([id number]) scope was introduced through the                            mouth, and advanced to the second part of duodenum.                            The upper GI endoscopy was accomplished without                            difficulty. The patient tolerated the procedure                            well. Scope In: 2:31:20 PM Scope Out: 2:37:56 PM Total Procedure Duration: 0 hours 6 minutes 36 seconds  Findings:      The examined esophagus was normal.      A small hiatal hernia was present.      Patchy mild inflammation characterized by congestion (edema) and       erythema was found in the gastric antrum. Biopsies were taken with a       cold forceps for Helicobacter pylori testing.      The examined duodenum was normal. Impression:               - Normal esophagus.                           - Small hiatal hernia.                           -  MILD Gastritis-NO SOURCE FOR LUQ PAIN IDENTIFIED. Moderate Sedation:      Moderate (conscious) sedation was administered by the endoscopy nurse       and supervised by the endoscopist. The following parameters were       monitored: oxygen saturation, heart rate, blood pressure, and response       to care. Total physician intraservice time was 11 minutes. Recommendation:           - Await pathology results.                           - Resume previous diet.                           - Continue present medications.                           - Return to my office in 2 months.                           - Patient has a contact number available for                            emergencies. The signs and symptoms of potential                             delayed complications were discussed with the                            patient. Return to normal activities tomorrow.                            Written discharge instructions were provided to the                            patient. Procedure Code(s):        --- Professional ---                           [id number], Esophagogastroduodenoscopy, flexible,                            transoral; with biopsy, single or multiple                           99152, Moderate sedation services provided by the                            same physician or other qualified health care                            professional performing the diagnostic or                            therapeutic service that the sedation supports,  requiring the presence of an independent trained                            observer to assist in the monitoring of the                            patient's level of consciousness and physiological                            status; initial 15 minutes of intraservice time,                            patient age 19 years or older Diagnosis Code(s):        --- Professional ---                           K44.9, Diaphragmatic hernia without obstruction or                            gangrene                           K29.70, Gastritis, unspecified, without bleeding                           R10.12, Left upper quadrant pain                           R10.13, Epigastric pain CPT copyright 2016 American Medical Association. All rights reserved. The codes documented in this report are preliminary and upon coder review may  be revised to meet current compliance requirements. Jonette Eva, MD Jonette Eva, MD 06/07/2017 2:50:03 PM This report has been signed electronically. Number of Addenda: 0

## 2017-06-10 ENCOUNTER — Telehealth: Payer: Self-pay | Admitting: Gastroenterology

## 2017-06-10 ENCOUNTER — Encounter (HOSPITAL_COMMUNITY): Payer: Self-pay | Admitting: Gastroenterology

## 2017-06-10 NOTE — Telephone Encounter (Signed)
PLEASE CALL PT. HER STOMACH BIOPSY SHOWS MILD GASTRITIS. NO OBVIOUS SOURCE FOR HER LUQ PIN WAS SEEN ON THE UPPER ENDOSCOPY. WE WILL OBTAIN HER CT SCAN FILMS AND HAVE THEM READ BY OUR RADIOLOGIST.   TAKE OMEPRAZOLE 30 MINUTES PRIOR TO first MEAL DAILY.  FOLLOW UP IN 2 MOS E30 LUQ PAIN.

## 2017-06-10 NOTE — Telephone Encounter (Signed)
I spoke with patient about getting her CT films. [facility]. I told her that I would be mailing her a release of records and for her to sign and mail it back to me. She is aware.

## 2017-06-10 NOTE — Telephone Encounter (Signed)
PT is aware of the results and plan and Darl PikesSusan is speaking with her about getting the films.

## 2017-06-10 NOTE — Telephone Encounter (Signed)
LMOM to call.

## 2017-06-10 NOTE — Telephone Encounter (Signed)
Request sent to have CT films mailed to us.

## 2017-06-14 ENCOUNTER — Telehealth: Payer: Self-pay | Admitting: Gastroenterology

## 2017-06-14 NOTE — Telephone Encounter (Signed)
REVIEWED. ENTER ORDER TO HAVE FILMS READ AT [facility]. PLEASE BRING TO [facility] FILM LIBRARY VIA POV NOT COURIER.

## 2017-06-14 NOTE — Telephone Encounter (Signed)
Per SL[facility]'s note we need to have the CT read by one of [facility] radiologist [facility].

## 2017-06-14 NOTE — Telephone Encounter (Signed)
REVIEWED-NO ADDITIONAL RECOMMENDATIONS. 

## 2017-06-14 NOTE — Telephone Encounter (Signed)
The patient mailed us a CD of her CT abd/pelvis and [facility]

## 2017-06-14 NOTE — Telephone Encounter (Signed)
I [facility] with [facility] at [facility] and made her aware that we will be dropping off the cd.  She said she will have Dr. Tyron RussellBoles review it.

## 2017-06-14 NOTE — Telephone Encounter (Signed)
We need to have CD of CT read by radiologist here. Isn't there a way to formally do this?

## 2017-06-14 NOTE — Telephone Encounter (Signed)
Noted  

## 2017-06-14 NOTE — Telephone Encounter (Signed)
Hendricks LimesJulie Lawson [facility]

## 2017-06-21 NOTE — Telephone Encounter (Signed)
CALLED RADIOLOGY. SPOKE TO CASSANDRA. FILMS WILL BE UPLOADED TO PACS TODAY AND SHE WILL HAVE CURBSIDE/COURTESY READ CALLED TO MY CELL AND FAX OR MAIL A HARD COPY TO ME.

## 2017-06-23 NOTE — Telephone Encounter (Signed)
Unable to upload filmin [place] due to FORMATTING. SENDING DISC BACK TO [facility] TO HAVE IT READ.

## 2017-07-20 ENCOUNTER — Ambulatory Visit: Payer: Medicare HMO | Admitting: Gastroenterology

## 2017-08-08 ENCOUNTER — Ambulatory Visit: Payer: Medicare Other | Admitting: Gastroenterology

## 2017-08-08 ENCOUNTER — Telehealth: Payer: Self-pay | Admitting: Gastroenterology

## 2017-08-08 ENCOUNTER — Encounter: Payer: Self-pay | Admitting: Gastroenterology

## 2017-08-08 NOTE — Telephone Encounter (Signed)
PATIENT WAS A NO SHOW AND LETTER SENT  °

## 2017-09-29 DIAGNOSIS — I472 Ventricular tachycardia: Secondary | ICD-10-CM | POA: Insufficient documentation

## 2017-09-29 DIAGNOSIS — I4729 Other ventricular tachycardia: Secondary | ICD-10-CM | POA: Insufficient documentation

## 2018-06-01 ENCOUNTER — Encounter: Payer: Self-pay | Admitting: Neurology

## 2018-06-15 ENCOUNTER — Other Ambulatory Visit: Payer: Self-pay

## 2018-06-15 ENCOUNTER — Encounter

## 2018-06-15 ENCOUNTER — Ambulatory Visit (INDEPENDENT_AMBULATORY_CARE_PROVIDER_SITE_OTHER): Payer: Medicare Other | Admitting: Neurology

## 2018-06-15 ENCOUNTER — Encounter: Payer: Self-pay | Admitting: Neurology

## 2018-06-15 VITALS — BP 142/78 | HR 76 | Ht <= 58 in | Wt 94.0 lb

## 2018-06-15 DIAGNOSIS — F03A0 Unspecified dementia, mild, without behavioral disturbance, psychotic disturbance, mood disturbance, and anxiety: Secondary | ICD-10-CM

## 2018-06-15 DIAGNOSIS — F0390 Unspecified dementia without behavioral disturbance: Secondary | ICD-10-CM

## 2018-06-15 NOTE — Patient Instructions (Signed)
1. Continue all your medications 2. Start looking into getting more help at home 3. We will get you connected with [employer] through the [employer] to help with local resources 4. Follow-up in 6 months, call for any changes

## 2018-06-15 NOTE — Progress Notes (Signed)
NEUROLOGY CONSULTATION NOTE  Laurie Humphrey MRN: [id number] DOB: 1929-07-08  Referring provider: Mickle Plumb, NP Primary care provider: Dr. Lindajo Royal  Reason for consult:  dementia  Thank you for your kind referral of Laurie Humphrey for consultation of the above symptoms. Although her history is well known to you, please allow me to reiterate it for the purpose of our medical record. The patient was accompanied to the clinic by her daughter and granddaughter who also provide collateral information. Records and images were personally reviewed where available.  HISTORY OF PRESENT ILLNESS: This is a pleasant 84 year old right-handed woman with a history of hypertension, complete heart block s/p pacemaker placement, hypothyroidism, presenting for evaluation of dementia. She feels her memory is okay. Family started noticing that she was a little forgetful Christmas time, but noticed a significant decline between December 2018 and March 2019. At that time, they were at a party with family and she did not remember family members. Around this time, family also noticed she was having difficulties managing her medications, taking the same medication twice. Around January, she could not remember how to fix certain foods. She lives with her son who has always managed finances. He started managing medications at the beginning of this month. She was driving locally until last month, they deny getting lost driving. In June, she was admitted for a UTI. Her daughter reports that around 2 weeks prior to her admission, she would look into the woods behind their house and report seeing a little boy and girl. She was admitted for a UTI on 05/11/18. Per notes, she had slurred speech on 6/23, she woke up that morning and her legs would not cooperate, she was drooling, and was noted to be very emotional. Her exam showed a mild right facial droop, right lower extremity weakness, difficulty blowing cheek on the right side.  CT head with and without contrast did not show any acute changes, she was unable to have an MRI due to pacemaker. It was also noted that family expressed concern that she may have gotten her medications mixed up and taken too much of something. She was agitated, confused, saying "I don't want to live anymore, I will walk to the road and hope a car will hit me." She required prn Haldol. She significantly improved and was discharged home on 05/19/18. On her PCP visit on 05/24/18, family continued to report hallucinations of seeing people outside. Repeat urinalysis was negative. Olanzapine was noted by her PCP, but family reports she is on Seroquel 25mg  qhs with significant improvement.  Her family reports that she is able to dress herself but needs help into the tub. She was doing pretty well getting in and out of the tub prior to the UTI, then all of a sudden she could not do it independently. She does not like showers and mostly does sponge baths. Her daughter comes every other day to help at home. Appetite and sleep are good, at one point she was wandering around the house at 2:30am asking who was in the house, or up looking for the bathroom 3 weeks ago, but since her PCP started Seroquel and family started hemp oil, she has been sleeping better. Family denies any further hallucinations. Her granddaughter does note a big improvement compared to 3 weeks ago, she is walking better, talking clearly, carrying on and following conversations. Family is also giving her Xanax TID.   Laboratory Data from PCP office show an elevated TSH 39.570  in 12/2017, medication adjusted, no recent TSH available for review. B12 level elevated >2000.  PAST MEDICAL HISTORY: Past Medical History:  Diagnosis Date  . Goiter, non-toxic   . Heart block    Pacemaker sept 2017  . Hypothyroidism   . Osteoporosis     PAST SURGICAL HISTORY: Past Surgical History:  Procedure Laterality Date  . BIOPSY  06/07/2017   Procedure: BIOPSY;   Surgeon: West BaliFields, Sandi L, MD;  Location: [facility];  Service: Endoscopy;;  gastric  . ESOPHAGOGASTRODUODENOSCOPY N/A 06/07/2017   Procedure: ESOPHAGOGASTRODUODENOSCOPY (EGD);  Surgeon: West BaliFields, Sandi L, MD;  Location: [facility];  Service: Endoscopy;  Laterality: N/A;  2:15pm  . PACEMAKER IMPLANT  07/2016   [facility]  . THYROID SURGERY      MEDICATIONS: Current Outpatient Medications on File Prior to Visit  Medication Sig Dispense Refill  . ALPRAZolam (XANAX) 1 MG tablet Take 1 mg by mouth 3 (three) times daily.    . celecoxib (CELEBREX) 100 MG capsule Take by mouth.    . cholecalciferol (VITAMIN D) 1000 units tablet Take 1,000 Units by mouth daily.    . folic acid (FOLVITE) 1 MG tablet Take 1 mg by mouth daily.    Marland Kitchen. levothyroxine (SYNTHROID, LEVOTHROID) 75 MCG tablet Take 1 tablet (75 mcg total) by mouth daily before breakfast. 30 tablet 6  . NON FORMULARY CBD drop, 2 drops under the tongue every 4 hours    . Polyethyl Glycol-Propyl Glycol (SYSTANE) 0.4-0.3 % SOLN Apply to eye.    Marland Kitchen. QUEtiapine (SEROQUEL) 25 MG tablet at bedtime.    . vitamin B-12 (CYANOCOBALAMIN) 1000 MCG tablet Take 1,000 mcg by mouth daily.     No current facility-administered medications on file prior to visit.     ALLERGIES: Allergies  Allergen Reactions  . Zyprexa [Olanzapine]     Insomnia, hallucination, neurotic behavior  . Sulfa Antibiotics     Childhood allergy, unknown    FAMILY HISTORY: Family History  Problem Relation Age of Onset  . Diabetes Mother   . Colon polyps Daughter   . Colon cancer Neg Hx     SOCIAL HISTORY: Social History   Socioeconomic History  . Marital status: Widowed    Spouse name: Not on file  . Number of children: Not on file  . Years of education: Not on file  . Highest education level: Not on file  Occupational History  . Not on file  Social Needs  . Financial resource strain: Not on file  . Food insecurity:    Worry: Not on file    Inability: Not on file  .  Transportation needs:    Medical: Not on file    Non-medical: Not on file  Tobacco Use  . Smoking status: Former Games developermoker  . Smokeless tobacco: Never Used  Substance and Sexual Activity  . Alcohol use: No  . Drug use: No  . Sexual activity: Not on file  Lifestyle  . Physical activity:    Days per week: Not on file    Minutes per session: Not on file  . Stress: Not on file  Relationships  . Social connections:    Talks on phone: Not on file    Gets together: Not on file    Attends religious service: Not on file    Active member of club or organization: Not on file    Attends meetings of clubs or organizations: Not on file    Relationship status: Not on file  . Intimate  partner violence:    Fear of current or ex partner: Not on file    Emotionally abused: Not on file    Physically abused: Not on file    Forced sexual activity: Not on file  Other Topics Concern  . Not on file  Social History Narrative  . Not on file    REVIEW OF SYSTEMS: Constitutional: No fevers, chills, or sweats, no generalized fatigue, change in appetite Eyes: No visual changes, double vision, eye pain Ear, nose and throat: No hearing loss, ear pain, nasal congestion, sore throat Cardiovascular: No chest pain, palpitations Respiratory:  No shortness of breath at rest or with exertion, wheezes GastrointestinaI: No nausea, vomiting, diarrhea, abdominal pain, fecal incontinence Genitourinary:  No dysuria, urinary retention or frequency Musculoskeletal:  No neck pain, back pain Integumentary: No rash, pruritus, skin lesions Neurological: as above Psychiatric: No depression, insomnia, anxiety Endocrine: No palpitations, fatigue, diaphoresis, mood swings, change in appetite, change in weight, increased thirst Hematologic/Lymphatic:  No anemia, purpura, petechiae. Allergic/Immunologic: no itchy/runny eyes, nasal congestion, recent allergic reactions, rashes  PHYSICAL EXAM: Vitals:   06/15/18 0907  BP: (!)  142/78  Pulse: 76  SpO2: 96%   General: No acute distress Head:  Normocephalic/atraumatic Eyes: Fundoscopic exam shows bilateral sharp discs, no vessel changes, exudates, or hemorrhages Neck: supple, no paraspinal tenderness, full range of motion Back: No paraspinal tenderness Heart: regular rate and rhythm Lungs: Clear to auscultation bilaterally. Vascular: No carotid bruits. Skin/Extremities: No rash, no edema Neurological Exam: Mental status: alert and oriented to person, place, and month/year/season, no dysarthria or aphasia, Fund of knowledge is reduced.  Recent and remote memory are impaired  Attention and concentration are normal.    Able to name objects and repeat phrases. CDT 4/5 MMSE - Mini Mental State Exam 06/15/2018  Orientation to time 3  Orientation to Place 3  Registration 3  Attention/ Calculation 1  Recall 0  Language- name 2 objects 2  Language- repeat 1  Language- follow 3 step command 3  Language- read & follow direction 1  Write a sentence 1  Copy design 0  Total score 18   Cranial nerves: CN I: not tested CN II: pupils equal, round and reactive to light, visual fields intact, fundi unremarkable. CN III, IV, VI:  full range of motion, no nystagmus, no ptosis CN V: facial sensation intact CN VII: upper and lower face symmetric CN VIII: hearing intact to finger rub CN IX, X: gag intact, uvula midline CN XI: sternocleidomastoid and trapezius muscles intact CN XII: tongue midline Bulk & Tone: normal, no fasciculations. Motor: 5/5 throughout with no pronator drift. Sensation: intact to light touch, cold, pin, vibration and joint position sense.  No extinction to double simultaneous stimulation.  Romberg test negative Deep Tendon Reflexes: +2 throughout except for absent ankle jerks bilaterally, no ankle clonus Plantar responses: downgoing bilaterally Cerebellar: no incoordination on finger to nose testing Gait: wide-based, no ataxia, unable to tandem  walk Tremor: none  IMPRESSION: This is a pleasant 84 year old right-handed woman with a history of hypertension, complete heart block s/p pacemaker placement, hypothyroidism, presenting for evaluation of dementia. Her neurological exam is non-focal, MMSE today 18/30, indicating mild dementia with behavioral disturbance, likely due to Alzheimer's disease. Findings were discussed with family today, we discussed how infections, getting out of routine, poor sleep, can cause delirium and worsening of cognitive issues, she has been much better over the past 3 weeks. Continue Seroquel 25mg  qhs. We discussed minimizing benzodiazepine use  in the elderly. We also discussed getting more help at home with a caregiver, they will be connected through [employer] with the Alzheimer's Association to help with local resources. We discussed the importance of control of vascular risk factors, physical exercise, and brain stimulation exercises for brain health. Follow-up in 6 months, they know to call for any changes.   Thank you for allowing me to participate in the care of this patient. Please do not hesitate to call for any questions or concerns.   Patrcia Dolly, M.D.  CC: Dr. Allyne Gee, Mickle Plumb, FNP

## 2018-06-26 ENCOUNTER — Encounter: Payer: Self-pay | Admitting: Neurology

## 2019-02-05 ENCOUNTER — Ambulatory Visit: Payer: Medicare Other | Admitting: Neurology

## 2019-02-05 ENCOUNTER — Other Ambulatory Visit: Payer: Self-pay

## 2020-06-08 ENCOUNTER — Emergency Department (HOSPITAL_COMMUNITY): Payer: Medicare Other

## 2020-06-08 ENCOUNTER — Encounter (HOSPITAL_COMMUNITY): Payer: Self-pay | Admitting: *Deleted

## 2020-06-08 ENCOUNTER — Inpatient Hospital Stay (HOSPITAL_COMMUNITY)
Admission: EM | Admit: 2020-06-08 | Discharge: 2020-06-13 | DRG: 521 | Disposition: A | Discharge status: Skilled Nursing Facility | Payer: Medicare Other | Source: Skilled Nursing Facility | Attending: Internal Medicine | Admitting: Internal Medicine

## 2020-06-08 ENCOUNTER — Other Ambulatory Visit: Payer: Self-pay

## 2020-06-08 DIAGNOSIS — F0390 Unspecified dementia without behavioral disturbance: Secondary | ICD-10-CM | POA: Diagnosis present

## 2020-06-08 DIAGNOSIS — T402X5A Adverse effect of other opioids, initial encounter: Secondary | ICD-10-CM | POA: Diagnosis not present

## 2020-06-08 DIAGNOSIS — Z87891 Personal history of nicotine dependence: Secondary | ICD-10-CM

## 2020-06-08 DIAGNOSIS — Z79899 Other long term (current) drug therapy: Secondary | ICD-10-CM | POA: Diagnosis not present

## 2020-06-08 DIAGNOSIS — Z950 Presence of cardiac pacemaker: Secondary | ICD-10-CM

## 2020-06-08 DIAGNOSIS — K219 Gastro-esophageal reflux disease without esophagitis: Secondary | ICD-10-CM | POA: Diagnosis present

## 2020-06-08 DIAGNOSIS — G92 Toxic encephalopathy: Secondary | ICD-10-CM | POA: Diagnosis not present

## 2020-06-08 DIAGNOSIS — E8889 Other specified metabolic disorders: Secondary | ICD-10-CM | POA: Diagnosis present

## 2020-06-08 DIAGNOSIS — T424X5A Adverse effect of benzodiazepines, initial encounter: Secondary | ICD-10-CM | POA: Diagnosis not present

## 2020-06-08 DIAGNOSIS — E559 Vitamin D deficiency, unspecified: Secondary | ICD-10-CM | POA: Diagnosis present

## 2020-06-08 DIAGNOSIS — R296 Repeated falls: Secondary | ICD-10-CM | POA: Diagnosis present

## 2020-06-08 DIAGNOSIS — N1831 Chronic kidney disease, stage 3a: Secondary | ICD-10-CM | POA: Diagnosis present

## 2020-06-08 DIAGNOSIS — S72001A Fracture of unspecified part of neck of right femur, initial encounter for closed fracture: Secondary | ICD-10-CM

## 2020-06-08 DIAGNOSIS — I442 Atrioventricular block, complete: Secondary | ICD-10-CM | POA: Diagnosis present

## 2020-06-08 DIAGNOSIS — Y92099 Unspecified place in other non-institutional residence as the place of occurrence of the external cause: Secondary | ICD-10-CM

## 2020-06-08 DIAGNOSIS — K5900 Constipation, unspecified: Secondary | ICD-10-CM | POA: Diagnosis not present

## 2020-06-08 DIAGNOSIS — T43215A Adverse effect of selective serotonin and norepinephrine reuptake inhibitors, initial encounter: Secondary | ICD-10-CM | POA: Diagnosis not present

## 2020-06-08 DIAGNOSIS — Z6820 Body mass index (BMI) 20.0-20.9, adult: Secondary | ICD-10-CM | POA: Diagnosis not present

## 2020-06-08 DIAGNOSIS — E440 Moderate protein-calorie malnutrition: Secondary | ICD-10-CM | POA: Insufficient documentation

## 2020-06-08 DIAGNOSIS — E039 Hypothyroidism, unspecified: Secondary | ICD-10-CM | POA: Diagnosis present

## 2020-06-08 DIAGNOSIS — W1830XA Fall on same level, unspecified, initial encounter: Secondary | ICD-10-CM | POA: Diagnosis present

## 2020-06-08 DIAGNOSIS — F0391 Unspecified dementia with behavioral disturbance: Secondary | ICD-10-CM | POA: Diagnosis not present

## 2020-06-08 DIAGNOSIS — S72002A Fracture of unspecified part of neck of left femur, initial encounter for closed fracture: Principal | ICD-10-CM | POA: Diagnosis present

## 2020-06-08 DIAGNOSIS — D638 Anemia in other chronic diseases classified elsewhere: Secondary | ICD-10-CM | POA: Diagnosis present

## 2020-06-08 DIAGNOSIS — D62 Acute posthemorrhagic anemia: Secondary | ICD-10-CM | POA: Diagnosis not present

## 2020-06-08 DIAGNOSIS — Y92230 Patient room in hospital as the place of occurrence of the external cause: Secondary | ICD-10-CM | POA: Diagnosis not present

## 2020-06-08 DIAGNOSIS — Z79890 Hormone replacement therapy (postmenopausal): Secondary | ICD-10-CM | POA: Diagnosis not present

## 2020-06-08 DIAGNOSIS — Z20822 Contact with and (suspected) exposure to covid-19: Secondary | ICD-10-CM | POA: Diagnosis present

## 2020-06-08 LAB — CBC WITH DIFFERENTIAL/PLATELET
Abs Immature Granulocytes: 0.03 10*3/uL (ref 0.00–0.07)
Basophils Absolute: 0.1 10*3/uL (ref 0.0–0.1)
Basophils Relative: 1 %
Eosinophils Absolute: 0.2 10*3/uL (ref 0.0–0.5)
Eosinophils Relative: 2 %
HCT: 33.8 % — ABNORMAL LOW (ref 36.0–46.0)
Hemoglobin: 10.6 g/dL — ABNORMAL LOW (ref 12.0–15.0)
Immature Granulocytes: 0 %
Lymphocytes Relative: 15 %
Lymphs Abs: 1.1 10*3/uL (ref 0.7–4.0)
MCH: 28.6 pg (ref 26.0–34.0)
MCHC: 31.4 g/dL (ref 30.0–36.0)
MCV: 91.1 fL (ref 80.0–100.0)
Monocytes Absolute: 0.6 10*3/uL (ref 0.1–1.0)
Monocytes Relative: 8 %
Neutro Abs: 5.6 10*3/uL (ref 1.7–7.7)
Neutrophils Relative %: 74 %
Platelets: 227 10*3/uL (ref 150–400)
RBC: 3.71 MIL/uL — ABNORMAL LOW (ref 3.87–5.11)
RDW: 14.4 % (ref 11.5–15.5)
WBC: 7.6 10*3/uL (ref 4.0–10.5)
nRBC: 0 % (ref 0.0–0.2)

## 2020-06-08 LAB — URINALYSIS, ROUTINE W REFLEX MICROSCOPIC
Bilirubin Urine: NEGATIVE
Glucose, UA: NEGATIVE mg/dL
Hgb urine dipstick: NEGATIVE
Ketones, ur: 5 mg/dL — AB
Leukocytes,Ua: NEGATIVE
Nitrite: NEGATIVE
Protein, ur: NEGATIVE mg/dL
Specific Gravity, Urine: 1.017 (ref 1.005–1.030)
pH: 7 (ref 5.0–8.0)

## 2020-06-08 LAB — BASIC METABOLIC PANEL
Anion gap: 10 (ref 5–15)
BUN: 22 mg/dL (ref 8–23)
CO2: 27 mmol/L (ref 22–32)
Calcium: 8.2 mg/dL — ABNORMAL LOW (ref 8.9–10.3)
Chloride: 102 mmol/L (ref 98–111)
Creatinine, Ser: 1.04 mg/dL — ABNORMAL HIGH (ref 0.44–1.00)
GFR calc Af Amer: 56 mL/min — ABNORMAL LOW (ref >60)
GFR calc non Af Amer: 48 mL/min — ABNORMAL LOW (ref >60)
Glucose, Bld: 98 mg/dL (ref 70–99)
Potassium: 4 mmol/L (ref 3.5–5.1)
Sodium: 139 mmol/L (ref 135–145)

## 2020-06-08 LAB — SARS CORONAVIRUS 2 BY RT PCR (HOSPITAL ORDER, PERFORMED IN ~~LOC~~ HOSPITAL LAB): SARS Coronavirus 2: NEGATIVE

## 2020-06-08 MED ORDER — CELECOXIB 100 MG PO CAPS
100.0000 mg | ORAL_CAPSULE | Freq: Every day | ORAL | Status: DC
  Administered 2020-06-08 – 2020-06-09 (×2): 100 mg via ORAL
  Filled 2020-06-08 (×3): qty 1

## 2020-06-08 MED ORDER — ENOXAPARIN SODIUM 40 MG/0.4ML ~~LOC~~ SOLN
40.0000 mg | SUBCUTANEOUS | Status: DC
  Administered 2020-06-08: 40 mg via SUBCUTANEOUS
  Filled 2020-06-08: qty 0.4

## 2020-06-08 MED ORDER — ACETAMINOPHEN 325 MG PO TABS
650.0000 mg | ORAL_TABLET | Freq: Four times a day (QID) | ORAL | Status: DC | PRN
  Administered 2020-06-08: 650 mg via ORAL
  Filled 2020-06-08: qty 2

## 2020-06-08 MED ORDER — QUETIAPINE FUMARATE 50 MG PO TABS
25.0000 mg | ORAL_TABLET | Freq: Every day | ORAL | Status: DC
  Administered 2020-06-08 – 2020-06-12 (×5): 25 mg via ORAL
  Filled 2020-06-08 (×5): qty 1

## 2020-06-08 MED ORDER — ESCITALOPRAM OXALATE 10 MG PO TABS
10.0000 mg | ORAL_TABLET | Freq: Every day | ORAL | Status: DC
  Administered 2020-06-09 – 2020-06-13 (×5): 10 mg via ORAL
  Filled 2020-06-08 (×5): qty 1

## 2020-06-08 MED ORDER — LEVOTHYROXINE SODIUM 75 MCG PO TABS
75.0000 ug | ORAL_TABLET | Freq: Every day | ORAL | Status: DC
  Administered 2020-06-10 – 2020-06-13 (×4): 75 ug via ORAL
  Filled 2020-06-08 (×4): qty 1

## 2020-06-08 MED ORDER — PANTOPRAZOLE SODIUM 40 MG PO TBEC
40.0000 mg | DELAYED_RELEASE_TABLET | Freq: Every day | ORAL | Status: DC
  Administered 2020-06-09 – 2020-06-13 (×5): 40 mg via ORAL
  Filled 2020-06-08 (×5): qty 1

## 2020-06-08 MED ORDER — ONDANSETRON HCL 4 MG/2ML IJ SOLN: 4.0000 mg | Freq: Four times a day (QID) | INTRAMUSCULAR | Status: DC | PRN

## 2020-06-08 MED ORDER — DIVALPROEX SODIUM 125 MG PO DR TAB
125.0000 mg | DELAYED_RELEASE_TABLET | Freq: Two times a day (BID) | ORAL | Status: DC
  Administered 2020-06-09 – 2020-06-13 (×8): 125 mg via ORAL
  Filled 2020-06-08 (×13): qty 1

## 2020-06-08 MED ORDER — ONDANSETRON HCL 4 MG PO TABS: 4.0000 mg | ORAL_TABLET | Freq: Four times a day (QID) | ORAL | Status: DC | PRN

## 2020-06-08 MED ORDER — KETOROLAC TROMETHAMINE 15 MG/ML IJ SOLN
15.0000 mg | Freq: Four times a day (QID) | INTRAMUSCULAR | Status: DC | PRN
  Administered 2020-06-08: 15 mg via INTRAVENOUS
  Filled 2020-06-08: qty 1

## 2020-06-08 MED ORDER — ALPRAZOLAM 0.5 MG PO TABS
0.5000 mg | ORAL_TABLET | Freq: Two times a day (BID) | ORAL | Status: DC
  Administered 2020-06-08 – 2020-06-09 (×3): 0.5 mg via ORAL
  Filled 2020-06-08 (×3): qty 1

## 2020-06-08 MED ORDER — ACETAMINOPHEN 650 MG RE SUPP: 650.0000 mg | Freq: Four times a day (QID) | RECTAL | Status: DC | PRN

## 2020-06-08 NOTE — H&P (Signed)
History and Physical    Laurie Humphrey DOB: 02/14/1929 DOA: 06/08/2020  PCP: [name], MD   Patient coming from: Assisted living facility  Chief Complaint: Left knee pain  HPI: Laurie Humphrey is a 86 y.o. female with medical history significant for dementia, hypothyroidism, osteoporosis, and complete heart block status post pacemaker placement who presented from her assisted living facility with complaints of left knee pain today.  She is normally ambulatory at baseline but had refused to put weight on her left leg today.  Apparently there were no recent falls documented, but she is noted to fall quite frequently and has had about 4 falls recently.  She does not complain of hip or low back pain and states that her knee bothers her especially with movement.  Further history cannot be obtained from the patient as she is a poor historian on account of her dementia.   ED Course: Stable vital signs noted.  Laboratory data with CBC thus far with no acute abnormalities.  Covid testing negative.  Patient appears comfortable.  Agreeable for transfer for orthopedic evaluation.  EDP has spoken with Dr. Victorino Dike with orthopedics who will assess in a.m.  Review of Systems: Cannot be adequately obtained given patient condition.  Past Medical History:  Diagnosis Date  . Goiter, non-toxic   . Heart block    Pacemaker sept 2017  . Hypothyroidism   . Osteoporosis     Past Surgical History:  Procedure Laterality Date  . BIOPSY  06/07/2017   Procedure: BIOPSY;  Surgeon: West Bali, MD;  [facility];  Service: Endoscopy;;  gastric  . ESOPHAGOGASTRODUODENOSCOPY N/A 06/07/2017   Procedure: ESOPHAGOGASTRODUODENOSCOPY (EGD);  Surgeon: West Bali, MD;  Location: [facility];  Service: Endoscopy;  Laterality: N/A;  2:15pm  . PACEMAKER IMPLANT  07/2016   [facility]  . THYROID SURGERY       reports that she has quit smoking. She has never used smokeless tobacco. She reports  that she does not drink alcohol and does not use drugs.  Allergies  Allergen Reactions  . Zyprexa [Olanzapine]     Insomnia, hallucination, neurotic behavior  . Sulfa Antibiotics     Childhood allergy, unknown    Family History  Problem Relation Age of Onset  . Diabetes Mother   . Colon polyps Daughter   . Colon cancer Neg Hx     Prior to Admission medications   Medication Sig Start Date End Date Taking? Authorizing Provider  acetaminophen (TYLENOL) 325 MG tablet Take 650 mg by mouth daily.   Yes [provider]  ALPRAZolam Prudy Feeler) 0.5 MG tablet Take 0.5 mg by mouth 2 (two) times daily.   Yes [provider]  celecoxib (CELEBREX) 100 MG capsule Take 100 mg by mouth daily.   Yes [provider]  divalproex (DEPAKOTE) 125 MG DR tablet Take 125 mg by mouth 2 (two) times daily. 05/22/20  Yes [provider]  escitalopram (LEXAPRO) 10 MG tablet Take 10 mg by mouth daily.  01/30/19  Yes [provider]  levothyroxine (SYNTHROID, LEVOTHROID) 75 MCG tablet Take 1 tablet (75 mcg total) by mouth daily before breakfast. 03/01/16  Yes Nida, Denman George, MD  melatonin 3 MG TABS tablet Take by mouth at bedtime.    Yes [provider]  pantoprazole (PROTONIX) 40 MG tablet Take 40 mg by mouth daily.   Yes [provider]  QUEtiapine (SEROQUEL) 25 MG tablet at bedtime. 06/07/18  Yes [provider]  ondansetron (ZOFRAN) 4 MG tablet Take 4 mg by mouth every 8 (eight) hours as needed. 05/02/20   [provider]    Physical Exam: Vitals:   06/08/20 1328 06/08/20 1343 06/08/20 1403 06/08/20 1514  BP: (!) 137/119 (!) 91/52    Pulse:  63 (!) 58   Resp:      Temp:    98.5 F (36.9 C)  TempSrc:    Oral  SpO2:  96% 92%   Weight:      Height:        Constitutional: NAD, calm, comfortable Vitals:   06/08/20 1328 06/08/20 1343 06/08/20 1403 06/08/20 1514  BP: (!) 137/119 (!) 91/52    Pulse:  63 (!) 58   Resp:        Temp:    98.5 F (36.9 C)  TempSrc:    Oral  SpO2:  96% 92%   Weight:      Height:       Eyes: lids and conjunctivae normal ENMT: Mucous membranes are moist.  Neck: normal, supple Respiratory: clear to auscultation bilaterally. Normal respiratory effort. No accessory muscle use.  Cardiovascular: Regular rate and rhythm, no murmurs. No extremity edema. Abdomen: no tenderness, no distention. Bowel sounds positive.  Musculoskeletal:  No joint deformity upper and lower extremities.   Skin: no rashes, lesions, ulcers.  Psychiatric: Pleasant affect  Labs on Admission: I have personally reviewed following labs and imaging studies  CBC: Recent Labs  Lab 06/08/20 1540  WBC 7.6  NEUTROABS 5.6  HGB 10.6*  HCT 33.8*  MCV 91.1  PLT 227   Basic Metabolic Panel: Recent Labs  Lab 06/08/20 1540  NA 139  K 4.0  CL 102  CO2 27  GLUCOSE 98  BUN 22  CREATININE 1.04*  CALCIUM 8.2*   GFR: Estimated Creatinine Clearance: 29.5 mL/min (A) (by C-G formula based on SCr of 1.04 mg/dL (H)). Liver Function Tests: No results for input(s): AST, ALT, ALKPHOS, BILITOT, PROT, ALBUMIN in the last 168 hours. No results for input(s): LIPASE, AMYLASE in the last 168 hours. No results for input(s): AMMONIA in the last 168 hours. Coagulation Profile: No results for input(s): INR, PROTIME in the last 168 hours. Cardiac Enzymes: No results for input(s): CKTOTAL, CKMB, CKMBINDEX, TROPONINI in the last 168 hours. BNP (last 3 results) No results for input(s): PROBNP in the last 8760 hours. HbA1C: No results for input(s): HGBA1C in the last 72 hours. CBG: No results for input(s): GLUCAP in the last 168 hours. Lipid Profile: No results for input(s): CHOL, HDL, LDLCALC, TRIG, CHOLHDL, LDLDIRECT in the last 72 hours. Thyroid Function Tests: No results for input(s): TSH, T4TOTAL, FREET4, T3FREE, THYROIDAB in the last 72 hours. Anemia Panel: No results for input(s): VITAMINB12, FOLATE, FERRITIN, TIBC,  IRON, RETICCTPCT in the last 72 hours. Urine analysis: No results found for: COLORURINE, APPEARANCEUR, LABSPEC, PHURINE, GLUCOSEU, HGBUR, BILIRUBINUR, KETONESUR, PROTEINUR, UROBILINOGEN, NITRITE, LEUKOCYTESUR  Radiological Exams on Admission: DG Chest Port 1 View  Result Date: 06/08/2020 CLINICAL DATA:  The patient suffered a left hip fracture in a fall today. Preoperative exam. EXAM: PORTABLE CHEST 1 VIEW COMPARISON:  PA and lateral chest 05/15/2020. FINDINGS: The lungs are hyperexpanded but clear. Heart size is normal. Aortic atherosclerosis. No pneumothorax or pleural effusion. Pacing device is in place. IMPRESSION: No acute disease. Aortic Atherosclerosis (ICD10-I70.0) and Emphysema (ICD10-J43.9). Electronically Signed   By: Drusilla Kanner M.D.   On: 06/08/2020 16:34   DG Knee Complete 4 Views Left  Result Date: 06/08/2020 CLINICAL DATA:  Pain. Patient with dementia. Unknown trauma history. EXAM: LEFT KNEE - COMPLETE 4+ VIEW COMPARISON:  None. FINDINGS: No acute fracture or dislocation. No joint effusion. Mild osteopenia. Joint spaces are maintained for age. Mild degenerate sclerosis involves the femoral condyles. IMPRESSION: No acute osseous abnormality. Electronically Signed   By: Jeronimo Greaves M.D.   On: 06/08/2020 11:10   DG Hip Unilat W or Wo Pelvis 2-3 Views Left  Result Date: 06/08/2020 CLINICAL DATA:  Knee pain. Unable to bear weight. EXAM: DG HIP (WITH OR WITHOUT PELVIS) 2-3V LEFT COMPARISON:  None. FINDINGS: There is a impacted sub capitellar displaced left femoral neck fracture with superior displacement of the distal fracture fragment. The acetabulum in the visualized pelvic bones are intact. Constipation. IMPRESSION: Impacted sub capitellar displaced left femoral neck fracture. Electronically Signed   By: Ted Mcalpine M.D.   On: 06/08/2020 12:29     Assessment/Plan Active Problems:   Hip fracture (HCC)    Left femoral neck fracture -Likely related to a recent  fall -Transfer to [facility] for further evaluation per orthopedics -N.p.o. after midnight -Pain management as ordered  History of hypothyroidism -Continue levothyroxine  History of dementia -Continue Lexapro, Seroquel, and Depakote  History of GERD -PPI  History of complete heart block with pacemaker   DVT prophylaxis: Lovenox Code Status: Full code Family Communication: Spoke with son on phone Tami Lin [phone] Disposition Plan:Transfer to [facility] for Ortho eval Consults called:Dr. Victorino Dike by EDP Admission status: Inpatient, MedSurg  Status is: Inpatient  Remains inpatient appropriate because:Ongoing active pain requiring inpatient pain management, Ongoing diagnostic testing needed not appropriate for outpatient work up and Inpatient level of care appropriate due to severity of illness   Dispo: The patient is from: ALF              Anticipated d/c is to: SNF              Anticipated d/c date is: 2 days              Patient currently is not medically stable to d/c.   Citlali Gautney D Sherryll Burger DO [facility]  If 7PM-7AM, please contact night-coverage [email or web address]  06/08/2020, 4:58 PM

## 2020-06-08 NOTE — ED Notes (Signed)
Call to [facility] [phone]  POC: Chip Boer   Informed pt will be admitted

## 2020-06-08 NOTE — ED Notes (Signed)
Patients Daughter Sandria Bales [phone]. Call when patient is transferred.

## 2020-06-08 NOTE — ED Notes (Addendum)
Report to Sam, RN [facility]   Call to daughter [name] phone with room number and message left

## 2020-06-08 NOTE — ED Notes (Signed)
Awaiting Bed assignment and transfer to [facility] for ortho procedure tomorrow

## 2020-06-08 NOTE — ED Notes (Signed)
Report [facility]

## 2020-06-08 NOTE — ED Provider Notes (Signed)
[facility] EMERGENCY DEPARTMENT Provider Note   CSN: [id number] Arrival date & time: 06/08/20  3329     History Chief Complaint  Patient presents with  . Knee Pain    left    Laurie Humphrey is a 86 y.o. female with a history as outlined below, including history of dementia, hypothyroidism, osteoporosis, complete heart block with pacemaker presenting from a local nursing facility [facility] for evaluation of complaint of left knee pain.  Patient is normally ambulatory at baseline but refused to put weight on her left leg today.  She has complaints of left knee pain, there is no witnessed falls, although upon conversation with her daughter [name] Per telephone, she states her mother falls frequently, stating she has had 4 falls recently.  She said after the last known fall she was at [facility] at which time her work-up was negative.  She has no other complaint of pain and when asked where it hurts she reaches for her left knee.  Level 5 caveat as patient is poor history giver.  The history is provided by the patient.       Past Medical History:  Diagnosis Date  . Goiter, non-toxic   . Heart block    Pacemaker sept 2017  . Hypothyroidism   . Osteoporosis     Patient Active Problem List   Diagnosis Date Noted  . NSVT (nonsustained ventricular tachycardia) (HCC) 09/29/2017  . LUQ pain 05/27/2017  . CHB (complete heart block) (HCC) 08/13/2016  . Hypothyroidism 09/01/2015  . Hypokalemia 05/31/2014  . GERD (gastroesophageal reflux disease) 05/31/2014  . Chest pain 05/31/2014    Past Surgical History:  Procedure Laterality Date  . BIOPSY  06/07/2017   Procedure: BIOPSY;  Surgeon: West Bali, MD;  [facility];  Service: Endoscopy;;  gastric  . ESOPHAGOGASTRODUODENOSCOPY N/A 06/07/2017   Procedure: ESOPHAGOGASTRODUODENOSCOPY (EGD);  Surgeon: West Bali, MD;  [facility];  Service: Endoscopy;  Laterality: N/A;  2:15pm  . PACEMAKER IMPLANT   07/2016   [facility]  . THYROID SURGERY       OB History   No obstetric history on file.     Family History  Problem Relation Age of Onset  . Diabetes Mother   . Colon polyps Daughter   . Colon cancer Neg Hx     Social History   Tobacco Use  . Smoking status: Former Games developer  . Smokeless tobacco: Never Used  Vaping Use  . Vaping Use: Never used  Substance Use Topics  . Alcohol use: No  . Drug use: No    Home Medications Prior to Admission medications   Medication Sig Start Date End Date Taking? Authorizing Provider  acetaminophen (TYLENOL) 325 MG tablet Take 650 mg by mouth daily.   Yes [provider]  ALPRAZolam Prudy Feeler) 0.5 MG tablet Take 0.5 mg by mouth 2 (two) times daily.   Yes [provider]  celecoxib (CELEBREX) 100 MG capsule Take 100 mg by mouth daily.   Yes [provider]  divalproex (DEPAKOTE) 125 MG DR tablet Take 125 mg by mouth 2 (two) times daily. 05/22/20  Yes [provider]  escitalopram (LEXAPRO) 10 MG tablet Take 10 mg by mouth daily.  01/30/19  Yes [provider]  levothyroxine (SYNTHROID, LEVOTHROID) 75 MCG tablet Take 1 tablet (75 mcg total) by mouth daily before breakfast. 03/01/16  Yes Nida, Denman George, MD  melatonin 3 MG TABS tablet Take by mouth at bedtime.  Yes [provider]  pantoprazole (PROTONIX) 40 MG tablet Take 40 mg by mouth daily.   Yes [provider]  QUEtiapine (SEROQUEL) 25 MG tablet at bedtime. 06/07/18  Yes [provider]  ondansetron (ZOFRAN) 4 MG tablet Take 4 mg by mouth every 8 (eight) hours as needed. 05/02/20   [provider]    Allergies    Zyprexa [olanzapine] and Sulfa antibiotics  Review of Systems   Review of Systems  Unable to perform ROS: Dementia  Musculoskeletal: Positive for arthralgias.    Physical Exam Updated Vital Signs BP (!) 91/52   Pulse (!) 58   Temp 98.5 F (36.9 C) (Oral)   Resp 16   Ht 5\' 2"  (1.575 m)   Wt  49.9 kg   SpO2 92%   BMI 20.12 kg/m   Physical Exam Constitutional:      Appearance: She is well-developed.  HENT:     Head: Atraumatic.  Cardiovascular:     Comments: Pulses equal bilaterally Musculoskeletal:        General: Tenderness present.     Cervical back: Normal range of motion.     Left knee: No deformity, effusion, erythema or ecchymosis. Tenderness present.     Comments: Superior patella.  No deformity, thigh is nontender.  Tender to palpation left groin.  Increased pain with attempts at passive hip ROM.  Skin:    General: Skin is warm and dry.  Neurological:     Mental Status: She is alert.     Sensory: No sensory deficit.     Deep Tendon Reflexes: Reflexes normal.     ED Results / Procedures / Treatments   Labs (all labs ordered are listed, but only abnormal results are displayed) Labs Reviewed  SARS CORONAVIRUS 2 BY RT PCR (HOSPITAL ORDER, PERFORMED IN [facility])  CBC WITH DIFFERENTIAL/PLATELET  BASIC METABOLIC PANEL    EKG None  Radiology DG Knee Complete 4 Views Left  Result Date: 06/08/2020 CLINICAL DATA:  Pain. Patient with dementia. Unknown trauma history. EXAM: LEFT KNEE - COMPLETE 4+ VIEW COMPARISON:  None. FINDINGS: No acute fracture or dislocation. No joint effusion. Mild osteopenia. Joint spaces are maintained for age. Mild degenerate sclerosis involves the femoral condyles. IMPRESSION: No acute osseous abnormality. Electronically Signed   By: 06/10/2020 M.D.   On: 06/08/2020 11:10   DG Hip Unilat W or Wo Pelvis 2-3 Views Left  Result Date: 06/08/2020 CLINICAL DATA:  Knee pain. Unable to bear weight. EXAM: DG HIP (WITH OR WITHOUT PELVIS) 2-3V LEFT COMPARISON:  None. FINDINGS: There is a impacted sub capitellar displaced left femoral neck fracture with superior displacement of the distal fracture fragment. The acetabulum in the visualized pelvic bones are intact. Constipation. IMPRESSION: Impacted sub capitellar displaced left  femoral neck fracture. Electronically Signed   By: 06/10/2020 M.D.   On: 06/08/2020 12:29    Procedures Procedures (including critical care time)  Medications Ordered in ED Medications - No data to display  ED Course  I have reviewed the triage vital signs and the nursing notes.  Pertinent labs & imaging results that were available during my care of the patient were reviewed by me and considered in my medical decision making (see chart for details).  Clinical Course as of Jun 09 1531  Sun Jun 08, 2020  1149 86 yo female w/ dementia presenting from nursing facility with complaint of left knee pain since this morning.  Apparently unable to walk today.  No reported falls or trauma.  On exam she has no focal knee ttp but feels pain near her knee with left hip flexion.  Pending xrays of the hip and vascular ultrasound to evaluate for fx or clot.     [MT]  1249 IMPRESSION: Impacted sub capitellar displaced left femoral neck fracture.    [MT]    Clinical Course User Index [MT] Trifan, Kermit Balo, MD   MDM Rules/Calculators/A&P                          Imaging reviewed, discussed with primary contact daughter Jen Mow who expresses great concerns over mother's ability to tolerate surgery.  She is very reluctant to consent to this.  Call placed to Dr. Victorino Dike and discussed case. He is willing to consult and discuss with family but [facility] to initiating this conversation.  Discussed with dg Sherri, explaining some of the pro's con's surgery vs palliative care. She has discussed with brother and both have agreed to try surgery for her.  Admitting labs ordered.  Consult to hospitalist for admission/transfer.  Discussed with Dr. Sherryll Burger will admit/transfer pt. Messaged Dr. Victorino Dike back with families decision to proceed with surgery.    Final Clinical Impression(s) / ED Diagnoses Final diagnoses:  Closed fracture of left hip, initial encounter [facility])     Rx / DC Orders ED Discharge Orders    None       Victoriano Lain 06/08/20 1650    Terald Sleeper, MD 06/08/20 [phone]

## 2020-06-08 NOTE — ED Triage Notes (Signed)
Patient presents to the ED by [facility] for left knee pain noted by her caregivers at [facility].  Staff cannot determine injury or fall.  Patient normally walks, staff noted patient unable to bear weight to left knee and leg.

## 2020-06-08 NOTE — ED Notes (Signed)
Call from pt daughter   She reports she was called and told her mother in hospital   Would like provider to call with plan  [phone]

## 2020-06-09 ENCOUNTER — Inpatient Hospital Stay (HOSPITAL_COMMUNITY): Payer: Medicare Other | Admitting: Anesthesiology

## 2020-06-09 ENCOUNTER — Inpatient Hospital Stay (HOSPITAL_COMMUNITY): Payer: Medicare Other

## 2020-06-09 ENCOUNTER — Encounter (HOSPITAL_COMMUNITY)
Admission: EM | Disposition: A | Discharge status: Skilled Nursing Facility | Payer: Self-pay | Source: Skilled Nursing Facility | Attending: Internal Medicine

## 2020-06-09 ENCOUNTER — Encounter (HOSPITAL_COMMUNITY): Payer: Self-pay | Admitting: Internal Medicine

## 2020-06-09 HISTORY — PX: HIP ARTHROPLASTY: SHX981

## 2020-06-09 LAB — CBC
HCT: 32 % — ABNORMAL LOW (ref 36.0–46.0)
Hemoglobin: 10 g/dL — ABNORMAL LOW (ref 12.0–15.0)
MCH: 28.3 pg (ref 26.0–34.0)
MCHC: 31.3 g/dL (ref 30.0–36.0)
MCV: 90.7 fL (ref 80.0–100.0)
Platelets: 232 10*3/uL (ref 150–400)
RBC: 3.53 MIL/uL — ABNORMAL LOW (ref 3.87–5.11)
RDW: 14.2 % (ref 11.5–15.5)
WBC: 11.1 10*3/uL — ABNORMAL HIGH (ref 4.0–10.5)
nRBC: 0 % (ref 0.0–0.2)

## 2020-06-09 LAB — MRSA PCR SCREENING: MRSA by PCR: NEGATIVE

## 2020-06-09 SURGERY — HEMIARTHROPLASTY, HIP, DIRECT ANTERIOR APPROACH, FOR FRACTURE
Anesthesia: General | Site: Hip | Laterality: Left

## 2020-06-09 MED ORDER — DEXAMETHASONE SODIUM PHOSPHATE 10 MG/ML IJ SOLN
INTRAMUSCULAR | Status: AC
  Filled 2020-06-09: qty 1

## 2020-06-09 MED ORDER — LIDOCAINE 2% (20 MG/ML) 5 ML SYRINGE
INTRAMUSCULAR | Status: DC | PRN
  Administered 2020-06-09: 100 mg via INTRAVENOUS

## 2020-06-09 MED ORDER — CHLORHEXIDINE GLUCONATE 4 % EX LIQD: 60.0000 mL | Freq: Once | CUTANEOUS | Status: DC

## 2020-06-09 MED ORDER — CEFAZOLIN SODIUM-DEXTROSE 2-3 GM-%(50ML) IV SOLR
INTRAVENOUS | Status: DC | PRN
Start: 2020-06-09 — End: 2020-06-09
  Administered 2020-06-09: 2 g via INTRAVENOUS

## 2020-06-09 MED ORDER — PHENYLEPHRINE HCL-NACL 10-0.9 MG/250ML-% IV SOLN
INTRAVENOUS | Status: DC | PRN
  Administered 2020-06-09: 75 ug/min via INTRAVENOUS

## 2020-06-09 MED ORDER — FENTANYL CITRATE (PF) 250 MCG/5ML IJ SOLN
INTRAMUSCULAR | Status: AC
  Filled 2020-06-09: qty 5

## 2020-06-09 MED ORDER — ONDANSETRON HCL 4 MG/2ML IJ SOLN
INTRAMUSCULAR | Status: AC
  Filled 2020-06-09: qty 2

## 2020-06-09 MED ORDER — DOCUSATE SODIUM 100 MG PO CAPS
100.0000 mg | ORAL_CAPSULE | Freq: Two times a day (BID) | ORAL | Status: DC
  Administered 2020-06-10 – 2020-06-13 (×7): 100 mg via ORAL
  Filled 2020-06-09 (×8): qty 1

## 2020-06-09 MED ORDER — MENTHOL 3 MG MT LOZG: 1.0000 | LOZENGE | OROMUCOSAL | Status: DC | PRN

## 2020-06-09 MED ORDER — POVIDONE-IODINE 10 % EX SWAB: 2.0000 "application " | Freq: Once | CUTANEOUS | Status: DC

## 2020-06-09 MED ORDER — SUGAMMADEX SODIUM 200 MG/2ML IV SOLN
INTRAVENOUS | Status: DC | PRN
Start: 2020-06-09 — End: 2020-06-09
  Administered 2020-06-09: 200 mg via INTRAVENOUS

## 2020-06-09 MED ORDER — ONDANSETRON HCL 4 MG PO TABS: 4.0000 mg | ORAL_TABLET | Freq: Four times a day (QID) | ORAL | Status: DC | PRN

## 2020-06-09 MED ORDER — ACETAMINOPHEN 325 MG PO TABS: 325.0000 mg | ORAL_TABLET | Freq: Four times a day (QID) | ORAL | Status: DC | PRN

## 2020-06-09 MED ORDER — EPHEDRINE SULFATE 50 MG/ML IJ SOLN
INTRAMUSCULAR | Status: DC | PRN
  Administered 2020-06-09: 15 mg via INTRAVENOUS
  Administered 2020-06-09 (×2): 5 mg via INTRAVENOUS

## 2020-06-09 MED ORDER — CEFAZOLIN SODIUM-DEXTROSE 2-4 GM/100ML-% IV SOLN
2.0000 g | Freq: Three times a day (TID) | INTRAVENOUS | Status: AC
  Administered 2020-06-10: 2 g via INTRAVENOUS
  Filled 2020-06-09 (×2): qty 100

## 2020-06-09 MED ORDER — PROPOFOL 10 MG/ML IV BOLUS
INTRAVENOUS | Status: DC | PRN
  Administered 2020-06-09: 80 mg via INTRAVENOUS

## 2020-06-09 MED ORDER — MORPHINE SULFATE (PF) 2 MG/ML IV SOLN
0.5000 mg | INTRAVENOUS | Status: DC | PRN
  Administered 2020-06-10: 1 mg via INTRAVENOUS
  Filled 2020-06-09: qty 1

## 2020-06-09 MED ORDER — ENOXAPARIN SODIUM 40 MG/0.4ML ~~LOC~~ SOLN: 40.0000 mg | SUBCUTANEOUS | Status: DC

## 2020-06-09 MED ORDER — SODIUM CHLORIDE 0.9 % IR SOLN
Status: DC | PRN
  Administered 2020-06-09: 3000 mL

## 2020-06-09 MED ORDER — PHENOL 1.4 % MT LIQD: 1.0000 | OROMUCOSAL | Status: DC | PRN

## 2020-06-09 MED ORDER — CHLORHEXIDINE GLUCONATE 0.12 % MT SOLN
OROMUCOSAL | Status: AC
  Administered 2020-06-09: 15 mL via OROMUCOSAL
  Filled 2020-06-09: qty 15

## 2020-06-09 MED ORDER — DEXAMETHASONE SODIUM PHOSPHATE 10 MG/ML IJ SOLN
INTRAMUSCULAR | Status: DC | PRN
  Administered 2020-06-09: 10 mg via INTRAVENOUS

## 2020-06-09 MED ORDER — ACETAMINOPHEN 500 MG PO TABS
500.0000 mg | ORAL_TABLET | Freq: Three times a day (TID) | ORAL | Status: DC
  Administered 2020-06-10 – 2020-06-13 (×9): 500 mg via ORAL
  Filled 2020-06-09 (×10): qty 1

## 2020-06-09 MED ORDER — PROPOFOL 10 MG/ML IV BOLUS
INTRAVENOUS | Status: AC
  Filled 2020-06-09: qty 20

## 2020-06-09 MED ORDER — PROMETHAZINE HCL 25 MG/ML IJ SOLN: 6.2500 mg | INTRAMUSCULAR | Status: DC | PRN

## 2020-06-09 MED ORDER — ONDANSETRON HCL 4 MG/2ML IJ SOLN
INTRAMUSCULAR | Status: DC | PRN
  Administered 2020-06-09: 4 mg via INTRAVENOUS

## 2020-06-09 MED ORDER — CHLORHEXIDINE GLUCONATE 0.12 % MT SOLN
15.0000 mL | Freq: Once | OROMUCOSAL | Status: AC
  Filled 2020-06-09: qty 15

## 2020-06-09 MED ORDER — LACTATED RINGERS IV SOLN: INTRAVENOUS | Status: DC | PRN

## 2020-06-09 MED ORDER — 0.9 % SODIUM CHLORIDE (POUR BTL) OPTIME
TOPICAL | Status: DC | PRN
  Administered 2020-06-09: 1000 mL

## 2020-06-09 MED ORDER — LORAZEPAM 2 MG/ML IJ SOLN
0.5000 mg | Freq: Once | INTRAMUSCULAR | Status: AC
  Administered 2020-06-09: 0.5 mg via INTRAVENOUS
  Filled 2020-06-09: qty 1

## 2020-06-09 MED ORDER — FENTANYL CITRATE (PF) 100 MCG/2ML IJ SOLN
INTRAMUSCULAR | Status: DC | PRN
  Administered 2020-06-09: 50 ug via INTRAVENOUS
  Administered 2020-06-09: 100 ug via INTRAVENOUS
  Administered 2020-06-09: 50 ug via INTRAVENOUS

## 2020-06-09 MED ORDER — ROCURONIUM BROMIDE 10 MG/ML (PF) SYRINGE
PREFILLED_SYRINGE | INTRAVENOUS | Status: DC | PRN
  Administered 2020-06-09: 40 mg via INTRAVENOUS

## 2020-06-09 MED ORDER — FENTANYL CITRATE (PF) 100 MCG/2ML IJ SOLN: 25.0000 ug | INTRAMUSCULAR | Status: DC | PRN

## 2020-06-09 MED ORDER — METOCLOPRAMIDE HCL 5 MG/ML IJ SOLN: 5.0000 mg | Freq: Three times a day (TID) | INTRAMUSCULAR | Status: DC | PRN

## 2020-06-09 MED ORDER — ACETAMINOPHEN 500 MG PO TABS: 1000.0000 mg | ORAL_TABLET | Freq: Once | ORAL | Status: DC

## 2020-06-09 MED ORDER — LACTATED RINGERS IV SOLN: INTRAVENOUS | Status: DC

## 2020-06-09 MED ORDER — METOCLOPRAMIDE HCL 5 MG PO TABS: 5.0000 mg | ORAL_TABLET | Freq: Three times a day (TID) | ORAL | Status: DC | PRN

## 2020-06-09 MED ORDER — EPHEDRINE 5 MG/ML INJ
INTRAVENOUS | Status: AC
  Filled 2020-06-09: qty 10

## 2020-06-09 MED ORDER — ONDANSETRON HCL 4 MG/2ML IJ SOLN: 4.0000 mg | Freq: Four times a day (QID) | INTRAMUSCULAR | Status: DC | PRN

## 2020-06-09 MED ORDER — CEFAZOLIN SODIUM-DEXTROSE 2-4 GM/100ML-% IV SOLN
INTRAVENOUS | Status: AC
  Administered 2020-06-09: 2000 mg
  Filled 2020-06-09: qty 100

## 2020-06-09 MED ORDER — TRAMADOL HCL 50 MG PO TABS: 50.0000 mg | ORAL_TABLET | Freq: Three times a day (TID) | ORAL | Status: DC | PRN

## 2020-06-09 SURGICAL SUPPLY — 55 items
BLADE SAW SAG 73X25 THK (BLADE) ×2
BLADE SAW SGTL 73X25 THK (BLADE) ×1 IMPLANT
BRUSH FEMORAL CANAL (MISCELLANEOUS) IMPLANT
BRUSH SCRUB EZ PLAIN DRY (MISCELLANEOUS) ×6 IMPLANT
COVER SURGICAL LIGHT HANDLE (MISCELLANEOUS) ×3 IMPLANT
COVER WAND RF STERILE (DRAPES) ×3 IMPLANT
DRAPE INCISE IOBAN 85X60 (DRAPES) ×3 IMPLANT
DRAPE ORTHO SPLIT 77X108 STRL (DRAPES) ×4
DRAPE SURG ORHT 6 SPLT 77X108 (DRAPES) ×2 IMPLANT
DRAPE U-SHAPE 47X51 STRL (DRAPES) ×3 IMPLANT
DRSG MEPILEX BORDER 4X8 (GAUZE/BANDAGES/DRESSINGS) ×3 IMPLANT
ELECT BLADE 6.5 EXT (BLADE) IMPLANT
ELECT CAUTERY BLADE 6.4 (BLADE) IMPLANT
ELECT REM PT RETURN 9FT ADLT (ELECTROSURGICAL) ×3
ELECTRODE REM PT RTRN 9FT ADLT (ELECTROSURGICAL) ×1 IMPLANT
GLOVE BIO SURGEON STRL SZ7.5 (GLOVE) ×3 IMPLANT
GLOVE BIO SURGEON STRL SZ8 (GLOVE) ×3 IMPLANT
GLOVE BIOGEL PI IND STRL 8 (GLOVE) ×2 IMPLANT
GLOVE BIOGEL PI INDICATOR 8 (GLOVE) ×4
GOWN STRL REUS W/ TWL LRG LVL3 (GOWN DISPOSABLE) ×2 IMPLANT
GOWN STRL REUS W/ TWL XL LVL3 (GOWN DISPOSABLE) ×1 IMPLANT
GOWN STRL REUS W/TWL 2XL LVL3 (GOWN DISPOSABLE) IMPLANT
GOWN STRL REUS W/TWL LRG LVL3 (GOWN DISPOSABLE) ×4
GOWN STRL REUS W/TWL XL LVL3 (GOWN DISPOSABLE) ×2
HANDPIECE INTERPULSE COAX TIP (DISPOSABLE)
HEAD FEM UNIPOLAR 44 OD STRL (Hips) ×3 IMPLANT
KIT BASIN OR (CUSTOM PROCEDURE TRAY) ×3 IMPLANT
KIT TURNOVER KIT B (KITS) ×3 IMPLANT
MANIFOLD NEPTUNE II (INSTRUMENTS) ×3 IMPLANT
NEEDLE 1/2 CIR MAYO (NEEDLE) IMPLANT
NS IRRIG 1000ML POUR BTL (IV SOLUTION) ×3 IMPLANT
PACK TOTAL JOINT (CUSTOM PROCEDURE TRAY) ×3 IMPLANT
PAD ARMBOARD 7.5X6 YLW CONV (MISCELLANEOUS) ×6 IMPLANT
PILLOW ABDUCTION MEDIUM (MISCELLANEOUS) IMPLANT
PRESSURIZER FEMORAL UNIV (MISCELLANEOUS) IMPLANT
RETRIEVER SUT HEWSON (MISCELLANEOUS) ×3 IMPLANT
SET HNDPC FAN SPRY TIP SCT (DISPOSABLE) IMPLANT
SPACER FEM TAPERED +0 12/14 (Hips) ×3 IMPLANT
STAPLER VISISTAT 35W (STAPLE) ×3 IMPLANT
STEM SUMMIT BASIC PRESSFIT SZ3 (Hips) ×1 IMPLANT
SUMMIT BASIC PRESSFIT SZ3 (Hips) ×3 IMPLANT
SUT ETHILON 2 0 PSLX (SUTURE) ×6 IMPLANT
SUT FIBERWIRE #2 38 T-5 BLUE (SUTURE) ×6
SUT VIC AB 1 CT1 18XCR BRD 8 (SUTURE) ×1 IMPLANT
SUT VIC AB 1 CT1 27 (SUTURE) ×4
SUT VIC AB 1 CT1 27XBRD ANBCTR (SUTURE) ×2 IMPLANT
SUT VIC AB 1 CT1 36 (SUTURE) ×9 IMPLANT
SUT VIC AB 1 CT1 8-18 (SUTURE) ×2
SUT VIC AB 2-0 CT1 27 (SUTURE) ×4
SUT VIC AB 2-0 CT1 TAPERPNT 27 (SUTURE) ×2 IMPLANT
SUTURE FIBERWR #2 38 T-5 BLUE (SUTURE) ×2 IMPLANT
TOWEL GREEN STERILE (TOWEL DISPOSABLE) ×3 IMPLANT
TOWEL GREEN STERILE FF (TOWEL DISPOSABLE) ×3 IMPLANT
TOWER CARTRIDGE SMART MIX (DISPOSABLE) IMPLANT
WATER STERILE IRR 1000ML POUR (IV SOLUTION) ×3 IMPLANT

## 2020-06-09 NOTE — Op Note (Signed)
06/09/2020  PATIENT:  Laurie Humphrey  07-31-1929  PRE-OPERATIVE DIAGNOSIS:  DISPLACED LEFT FEMORAL NECK FRACTURE  POST-OPERATIVE DIAGNOSIS:  DISPLACED LEFT FEMORAL NECK FRACTURE  PROCEDURE:  Procedure(s): UNIPOLAR HEMIARTHROPLASTY OF THE LEFT HIP with DePuy [place] Basic #3 femoral stem, standard neck, 44 mm head  SURGEON:  Surgeon(s) and Role:    Myrene Galas, MD - Primary  PHYSICIAN ASSISTANT: Montez Morita, PA-C  ANESTHESIA:   general  EBL:  200 mL   BLOOD ADMINISTERED:none  DRAINS: none   LOCAL MEDICATIONS USED:  NONE  SPECIMEN:  No Specimen  DISPOSITION OF SPECIMEN:  N/A  COUNTS:  YES  TOURNIQUET:  * No tourniquets in log *  DICTATION: Note written in EPIC  PLAN OF CARE: Admit to inpatient   PATIENT DISPOSITION:  PACU - hemodynamically stable.   Delay start of Pharmacological VTE agent (>24hrs) due to surgical blood loss or risk of bleeding: no  BRIEF SUMMARY OF INDICATION FOR PROCEDURE:  [name] is a very pleasant 86 y.o. with dementia, who sustained an unwitnessed fall producing inability to bear weight, shortening, and external rotation of the extremity.  She was seen and evaluated with the recommendation for hemiarthroplasty. I discussed with the patient and family the risks and benefits, inclding the potential for leg length inequality, dislocation or instability, arthritis, loss of motion, DVT, PE, heart attack, stroke, and death.  Consent was given to proceed.  BRIEF SUMMARY OF PROCEDURE:  The patient was taken to the operating room where general anesthesia was induced and after administration of preoperative antibiotics consisting of 2 g of Ancef.  She was positioned with the left side up and all prominences were padded appropriately.  We made a 10 cm incision after the time-out, carrying dissection down to the IT band, was split in line with the skin.  Cerebellar retractor was placed and we were able to then flex and internally rotate the  hip releasing the piriformis and short rotators at their insertions.  The capsule was then T'd, tagging the corners with #1 Vicryl.  The neck cut was refined using a cutting guide and then this was followed by removal of the head, which sized perfectly to 44 mm. Acetabular trials were placed, confirming this size as the best fit. Mueller and Cobra retractors were placed along the proximal femur, which was then prepared with the canal finder,  then lateralizer, followed by reamers up to 4, and the broaches, achieving  outstanding fit and fill with the #3 broach.  The calcar reamer was used to refine the cut as we were using a low demand stem.  The canal was irrigated thoroughly and the acetabulum once again searched multiple times for fragments and irrigated thoroughly.  Trial components were placed and the patient had outstanding stability in combine 90 degrees of flexion, adduction, and internal rotation as well as in external rotation and extension.  Consequently, actual components were placed.  My assistant Montez Morita, was necessary for delivery and control of the proximal femur during preparation, also during relocation and dislocation of the trial components as well as relocation of the actual components.  He assisted me with wound closure as well.  I did repair the capsule with #1 Vicryl and then used #2 FiberWire through bone tunnels to repair the short rotators and piriformis.  This was followed by a #1 Vicryl for the IT band and lastly 2-0 Vicryl and nylon for the subcutaneous and skin.  Sterile gently compressive dressing was applied.  The patient was awakened from anesthesia and transported to the PACU in stable condition.  PROGNOSIS:  The patient will be weightbearing as tolerated with posterior hip precautions.  Patient has an elevated risk of complications related to declining overall health and mobility.  [name] ROHINI JAROSZEWSKI remains on the Medical Service and will be on DVT  prophylaxis mechanically and with Lovenox while in the hospital, but will likely not be a candidate for long-term prophylaxis given the dementia.     Doralee Albino. Carola Frost, M.D.

## 2020-06-09 NOTE — Transfer of Care (Signed)
Immediate Anesthesia Transfer of Care Note  Patient: Laurie Humphrey  Procedure(s) Performed: ARTHROPLASTY BIPOLAR HIP (HEMIARTHROPLASTY) (Left Hip)  Patient Location: PACU  Anesthesia Type:General  Level of Consciousness: responds to stimulation  Airway & Oxygen Therapy: Patient Spontanous Breathing and Patient connected to nasal cannula oxygen  Post-op Assessment: Report given to RN and Post -op Vital signs reviewed and stable  Post vital signs: Reviewed and stable  Last Vitals:  Vitals Value Taken Time  BP 140/61 06/09/20 1859  Temp    Pulse 64 06/09/20 1900  Resp 15 06/09/20 1900  SpO2 94 % 06/09/20 1900  Vitals shown include unvalidated device data.  Last Pain:  Vitals:   06/09/20 1344  TempSrc: Oral         Complications: No complications documented.

## 2020-06-09 NOTE — Progress Notes (Signed)
PHARMACY NOTE:  ANTIMICROBIAL RENAL DOSAGE ADJUSTMENT  Current antimicrobial regimen includes a mismatch between antimicrobial dosage and estimated renal function.  As per policy approved by the Pharmacy & Therapeutics and Medical Executive Committees, the antimicrobial dosage will be adjusted accordingly.  Current antimicrobial dosage:  Cefazolin 2 gm IV Q 6 hrs X 2 doses  Indication: Surgical prophylaxis  Renal Function:  Estimated Creatinine Clearance: 29.5 mL/min (A) (by C-G formula based on SCr of 1.04 mg/dL (H)). []      On intermittent HD, scheduled: []      On CRRT    Antimicrobial dosage has been changed to: Cefazolin 2 gm IV Q 8 hrs X 2 doses  Thank you for allowing pharmacy to be a part of this patient's care.  , PharmD, BCPS, [facility] Clinical Pharmacist 06/09/2020 9:17 PM

## 2020-06-09 NOTE — Social Work (Signed)
CSW acknowledging pt admitted from [facility], will follow for possible SNF placement. Await post-op therapy recommendations needed to best determine disposition/for insurance authorization.   Octavio Graves, MSW, LCSW [facility] Clinical Social Work

## 2020-06-09 NOTE — Consult Note (Signed)
Reason for Consult:Left hip fx Referring Physician: Gershon Cull Laurie Humphrey is an 86 y.o. female.  HPI: Taimi presented from the ALF where she resides with c/o knee pain. She had been refusing to put weight down on her left leg. There were no reports of falls but she apparently falls often. Workup showed a left fem neck fx and orthopedic surgery was consulted. She is moderately demented and cannot contribute to history.  Past Medical History:  Diagnosis Date  . Goiter, non-toxic   . Heart block    Pacemaker sept 2017  . Hypothyroidism   . Osteoporosis     Past Surgical History:  Procedure Laterality Date  . BIOPSY  06/07/2017   Procedure: BIOPSY;  Surgeon: West Bali, MD;  Location: [facility];  Service: Endoscopy;;  gastric  . ESOPHAGOGASTRODUODENOSCOPY N/A 06/07/2017   Procedure: ESOPHAGOGASTRODUODENOSCOPY (EGD);  Surgeon: [place] Bali, MD;  Location: [facility];  Service: Endoscopy;  Laterality: N/A;  2:15pm  . PACEMAKER IMPLANT  07/2016   [facility]  . THYROID SURGERY      Family History  Problem Relation Age of Onset  . Diabetes Mother   . Colon polyps Daughter   . Colon cancer Neg Hx     Social History:  reports that she has quit smoking. She has never used smokeless tobacco. She reports that she does not drink alcohol and does not use drugs.  Allergies:  Allergies  Allergen Reactions  . Zyprexa [Olanzapine]     Insomnia, hallucination, neurotic behavior  . Sulfa Antibiotics     Childhood allergy, unknown    Medications: I have reviewed the patient's current medications.  Results for orders placed or performed during the hospital encounter of 06/08/20 (from the past 48 hour(s))  SARS Coronavirus 2 by RT PCR (hospital order, performed in [facility] hospital lab) Nasopharyngeal Nasopharyngeal Swab     Status: None   Collection Time: 06/08/20  2:22 PM   Specimen: Nasopharyngeal Swab  Result Value Ref Range   SARS Coronavirus 2 NEGATIVE NEGATIVE     Comment: (NOTE) SARS-CoV-2 target nucleic acids are NOT DETECTED.  The SARS-CoV-2 RNA is generally detectable in upper and lower respiratory specimens during the acute phase of infection. The lowest concentration of SARS-CoV-2 viral copies this assay can detect is 250 copies / mL. A negative result does not preclude SARS-CoV-2 infection and should not be used as the sole basis for treatment or other patient management decisions.  A negative result may occur with improper specimen collection / handling, submission of specimen other than nasopharyngeal swab, presence of viral mutation(s) within the areas targeted by this assay, and inadequate number of viral copies (<250 copies / mL). A negative result must be combined with clinical observations, patient history, and epidemiological information.  Fact Sheet for Patients:   BoilerBrush.com.cy  Fact Sheet for Healthcare Providers: https://pope.com/  This test is not yet approved or  cleared by the [place] FDA and has been authorized for detection and/or diagnosis of SARS-CoV-2 by FDA under an Emergency Use Authorization (EUA).  This EUA will remain in effect (meaning this test can be used) for the duration of the COVID-19 declaration under Section 564(b)(1) of the Act, 21 U.S.C. section 360bbb-3(b)(1), unless the authorization is terminated or revoked sooner.  Performed at [facility], [address] [place], [place] [address]   CBC with Differential     Status: Abnormal   Collection Time: 06/08/20  3:40 PM  Result Value Ref  Range   WBC 7.6 4.0 - 10.5 K/uL   RBC 3.71 (L) 3.87 - 5.11 MIL/uL   Hemoglobin 10.6 (L) 12.0 - 15.0 g/dL   HCT 67.2 (L) 36 - 46 %   MCV 91.1 80.0 - 100.0 fL   MCH 28.6 26.0 - 34.0 pg   MCHC 31.4 30.0 - 36.0 g/dL   RDW 09.4 70.9 - 62.8 %   Platelets 227 150 - 400 K/uL   nRBC 0.0 0.0 - 0.2 %   Neutrophils Relative % 74 %   Neutro Abs 5.6 1.7 - 7.7 K/uL    Lymphocytes Relative 15 %   Lymphs Abs 1.1 0.7 - 4.0 K/uL   Monocytes Relative 8 %   Monocytes Absolute 0.6 0 - 1 K/uL   Eosinophils Relative 2 %   Eosinophils Absolute 0.2 0 - 0 K/uL   Basophils Relative 1 %   Basophils Absolute 0.1 0 - 0 K/uL   Immature Granulocytes 0 %   Abs Immature Granulocytes 0.03 0.00 - 0.07 K/uL    Comment: Performed at [facility], [address] [place], [place] [address]  Basic metabolic panel     Status: Abnormal   Collection Time: 06/08/20  3:40 PM  Result Value Ref Range   Sodium 139 135 - 145 mmol/L   Potassium 4.0 3.5 - 5.1 mmol/L   Chloride 102 98 - 111 mmol/L   CO2 27 22 - 32 mmol/L   Glucose, Bld 98 70 - 99 mg/dL    Comment: Glucose reference range applies only to samples taken after fasting for at least 8 hours.   BUN 22 8 - 23 mg/dL   Creatinine, Ser 4.76 (H) 0.44 - 1.00 mg/dL   Calcium 8.2 (L) 8.9 - 10.3 mg/dL   GFR calc non Af Amer 48 (L) >60 mL/min   GFR calc Af Amer 56 (L) >60 mL/min   Anion gap 10 5 - 15    Comment: Performed at [facility], [address], [place], [place] [address]  Urinalysis, Routine w reflex microscopic     Status: Abnormal   Collection Time: 06/08/20 11:26 PM  Result Value Ref Range   Color, Urine YELLOW YELLOW   APPearance HAZY (A) CLEAR   Specific Gravity, Urine 1.017 1.005 - 1.030   pH 7.0 5.0 - 8.0   Glucose, UA NEGATIVE NEGATIVE mg/dL   Hgb urine dipstick NEGATIVE NEGATIVE   Bilirubin Urine NEGATIVE NEGATIVE   Ketones, ur 5 (A) NEGATIVE mg/dL   Protein, ur NEGATIVE NEGATIVE mg/dL   Nitrite NEGATIVE NEGATIVE   Leukocytes,Ua NEGATIVE NEGATIVE    Comment: Performed at [facility], [address], [place], [place] [address]    DG Chest Port 1 View  Result Date: 06/08/2020 CLINICAL DATA:  The patient suffered a left hip fracture in a fall today. Preoperative exam. EXAM: PORTABLE CHEST 1 VIEW COMPARISON:  PA and lateral chest 05/15/2020. FINDINGS: The lungs are hyperexpanded but clear. Heart size is  normal. Aortic atherosclerosis. No pneumothorax or pleural effusion. Pacing device is in place. IMPRESSION: No acute disease. Aortic Atherosclerosis (ICD10-I70.0) and Emphysema (ICD10-J43.9). Electronically Signed   By: Drusilla Kanner M.D.   On: 06/08/2020 16:34   DG Knee Complete 4 Views Left  Result Date: 06/08/2020 CLINICAL DATA:  Pain. Patient with dementia. Unknown trauma history. EXAM: LEFT KNEE - COMPLETE 4+ VIEW COMPARISON:  None. FINDINGS: No acute fracture or dislocation. No joint effusion. Mild osteopenia. Joint spaces are maintained for age. Mild degenerate sclerosis involves the  femoral condyles. IMPRESSION: No acute osseous abnormality. Electronically Signed   By: Jeronimo Greaves M.D.   On: 06/08/2020 11:10   DG Hip Unilat W or Wo Pelvis 2-3 Views Left  Result Date: 06/08/2020 CLINICAL DATA:  Knee pain. Unable to bear weight. EXAM: DG HIP (WITH OR WITHOUT PELVIS) 2-3V LEFT COMPARISON:  None. FINDINGS: There is a impacted sub capitellar displaced left femoral neck fracture with superior displacement of the distal fracture fragment. The acetabulum in the visualized pelvic bones are intact. Constipation. IMPRESSION: Impacted sub capitellar displaced left femoral neck fracture. Electronically Signed   By: Ted Mcalpine M.D.   On: 06/08/2020 12:29    Review of Systems  Unable to perform ROS: Dementia   Blood pressure (!) 174/74, pulse (!) 59, temperature 98.3 F (36.8 C), temperature source Oral, resp. rate 18, height 5\' 2"  (1.575 m), weight 49.9 kg, SpO2 100 %. Physical Exam Constitutional:      General: She is not in acute distress.    Appearance: She is well-developed. She is not diaphoretic.  HENT:     Head: Normocephalic and atraumatic.  Eyes:     General: No scleral icterus.       Right eye: No discharge.        Left eye: No discharge.     Conjunctiva/sclera: Conjunctivae normal.  Cardiovascular:     Rate and Rhythm: Normal rate and regular rhythm.  Pulmonary:      Effort: Pulmonary effort is normal. No respiratory distress.  Musculoskeletal:     Cervical back: Normal range of motion.     Comments: LLE No traumatic wounds, ecchymosis, or rash  Mild hip TTP  No knee or ankle effusion  Knee stable to varus/ valgus and anterior/posterior stress  Sens DPN, SPN, TN intact  Motor EHL, ext, flex, evers 5/5  DP 1+, PT 1+, No significant edema  Skin:    General: Skin is warm and dry.  Neurological:     Mental Status: She is alert.  Psychiatric:        Behavior: Behavior normal.     Assessment/Plan: Left hip fx -- Plan hip hemi today with Dr. . Please keep NPO. Multiple medical problems including dementia, hypothyroidism, osteoporosis, and complete heart block status post pacemaker placement -- per primary service    Carola Frost, PA-C Orthopedic Surgery [phone] 06/09/2020, 10:59 AM

## 2020-06-09 NOTE — Progress Notes (Signed)
PROGRESS NOTE    Laurie Humphrey  FTD:[id number] DOB: 1929-08-26 DOA: 06/08/2020 PCP: Laurie Fells, MD      Brief Narrative:  Laurie Humphrey is a 86 y.o. F with dementia lives in memory care, CHB s/p pacemaker, hypothyroidism and osteoporosis who presented with LEFT hip pain after a fall.  Patient reportedly falls frequently.  The morning of admission, would not bear weight (she normally ambulates often without assistance) and so was sent to the ER.  In the ER, pelvis radiograph showed a LEFT femoral neck fracture.  Case discussed with Orthopedics, who recommended operative repair.      Assessment & Plan:  Acute LEFT femoral neck fracture To OR today with Orthopedics.  Dementia -Continue Depakote, escitalopram, Seroquel -Hold Xanax  Hypothyroidism -Continue levothyroxine  Complete heart block s/p pacemaker  Anemia Hgb 10 prior to surgery -Trend Hgb post-op   Dark urine Unclear significance -Push fluids post op         Disposition: Status is: Inpatient  Remains inpatient appropriate because:requires operative repair of hip fracture   Dispo: The patient is from: ALF              Anticipated d/c is to: SNF              Anticipated d/c date is: 3 days              Patient currently is not medically stable to d/c.              MDM: The below labs and imaging reports were reviewed and summarized above.  Medication management as above.  DVT prophylaxis: Place and maintain sequential compression device Start: 06/09/20 0829  Code Status: FULL Family Communication: daughter    Consultants:   Orthopedics  Procedures:   7/19 hip ORIF  Antimicrobials:      Culture data:              Subjective: Patient appears well.  No confusion.  No dysuria or urinary discomfort.  No cough, no dyspnea.  No palpitations, chest pain, orthopnea, swelling.    Objective: Vitals:   06/08/20 2351 06/09/20 0441 06/09/20 1102 06/09/20 1344  BP:  (!) 155/92 (!) 174/74 (!) 147/61 (!) 176/80  Pulse: (!) 59 (!) 59 (!) 59 60  Resp: 18 18 16 18   Temp: 98.2 F (36.8 C) 98.3 F (36.8 C) (!) 97.3 F (36.3 C) 97.8 F (36.6 C)  TempSrc: Oral Oral Oral Oral  SpO2: 100% 100% 100% 100%  Weight:      Height:        Intake/Output Summary (Last 24 hours) at 06/09/2020 1631 Last data filed at 06/09/2020 1300 Gross per 24 hour  Intake 0 ml  Output --  Net 0 ml   Filed Weights   06/08/20 0927  Weight: 49.9 kg    Examination: General appearance: thin adult female, alert and in no acute distress.   HEENT: Anicteric, conjunctiva pink, lids and lashes normal. No nasal deformity, discharge, epistaxis.  Lips moist.   Skin: Warm and dry.  no jaundice.  No suspicious rashes or lesions. Cardiac: RRR, nl S1-S2, no murmurs appreciated.  Capillary refill is brisk.  JVP not visible.  No LE edema.  Radial pulses 2+ and symmetric. Respiratory: Normal respiratory rate and rhythm.  CTAB without rales or wheezes. Abdomen: Abdomen soft.  No TTP or guadring. No ascites, distension, hepatosplenomegaly.   MSK: No deformities or effusions.  Guards left hip.  No deformity visible on  my exam. Neuro: Awake and alert.  EOMI, moves all extremitie but gaurds hip movement.  Speech fluent.    Psych: Sensorium intact and responding to questions, attention normal. Affect pleasant.  Judgment and insight appear impaired by dementia.    Data Reviewed: I have personally reviewed following labs and imaging studies:  CBC: Recent Labs  Lab 06/08/20 1540  WBC 7.6  NEUTROABS 5.6  HGB 10.6*  HCT 33.8*  MCV 91.1  PLT 227   Basic Metabolic Panel: Recent Labs  Lab 06/08/20 1540  NA 139  K 4.0  CL 102  CO2 27  GLUCOSE 98  BUN 22  CREATININE 1.04*  CALCIUM 8.2*   GFR: Estimated Creatinine Clearance: 29.5 mL/min (A) (by C-G formula based on SCr of 1.04 mg/dL (H)). Liver Function Tests: No results for input(s): AST, ALT, ALKPHOS, BILITOT, PROT, ALBUMIN in the  last 168 hours. No results for input(s): LIPASE, AMYLASE in the last 168 hours. No results for input(s): AMMONIA in the last 168 hours. Coagulation Profile: No results for input(s): INR, PROTIME in the last 168 hours. Cardiac Enzymes: No results for input(s): CKTOTAL, CKMB, CKMBINDEX, TROPONINI in the last 168 hours. BNP (last 3 results) No results for input(s): PROBNP in the last 8760 hours. HbA1C: No results for input(s): HGBA1C in the last 72 hours. CBG: No results for input(s): GLUCAP in the last 168 hours. Lipid Profile: No results for input(s): CHOL, HDL, LDLCALC, TRIG, CHOLHDL, LDLDIRECT in the last 72 hours. Thyroid Function Tests: No results for input(s): TSH, T4TOTAL, FREET4, T3FREE, THYROIDAB in the last 72 hours. Anemia Panel: No results for input(s): VITAMINB12, FOLATE, FERRITIN, TIBC, IRON, RETICCTPCT in the last 72 hours. Urine analysis:    Component Value Date/Time   COLORURINE YELLOW 06/08/2020 2326   APPEARANCEUR HAZY (A) 06/08/2020 2326   LABSPEC 1.017 06/08/2020 2326   PHURINE 7.0 06/08/2020 2326   GLUCOSEU NEGATIVE 06/08/2020 2326   HGBUR NEGATIVE 06/08/2020 2326   BILIRUBINUR NEGATIVE 06/08/2020 2326   KETONESUR 5 (A) 06/08/2020 2326   PROTEINUR NEGATIVE 06/08/2020 2326   NITRITE NEGATIVE 06/08/2020 2326   LEUKOCYTESUR NEGATIVE 06/08/2020 2326   Sepsis Labs: @LABRCNTIP (procalcitonin:4,lacticacidven:4)  ) Recent Results (from the past 240 hour(s))  SARS Coronavirus 2 by RT PCR (hospital order, performed in [facility] hospital lab) Nasopharyngeal Nasopharyngeal Swab     Status: None   Collection Time: 06/08/20  2:22 PM   Specimen: Nasopharyngeal Swab  Result Value Ref Range Status   SARS Coronavirus 2 NEGATIVE NEGATIVE Final    Comment: (NOTE) SARS-CoV-2 target nucleic acids are NOT DETECTED.  The SARS-CoV-2 RNA is generally detectable in upper and lower respiratory specimens during the acute phase of infection. The lowest concentration of  SARS-CoV-2 viral copies this assay can detect is 250 copies / mL. A negative result does not preclude SARS-CoV-2 infection and should not be used as the sole basis for treatment or other patient management decisions.  A negative result may occur with improper specimen collection / handling, submission of specimen other than nasopharyngeal swab, presence of viral mutation(s) within the areas targeted by this assay, and inadequate number of viral copies (<250 copies / mL). A negative result must be combined with clinical observations, patient history, and epidemiological information.  Fact Sheet for Patients:   06/10/20  Fact Sheet for Healthcare Providers: BoilerBrush.com.cy  This test is not yet approved or  cleared by the https://pope.com/ FDA and has been authorized for detection and/or diagnosis of SARS-CoV-2 by FDA under an  Emergency Use Authorization (EUA).  This EUA will remain in effect (meaning this test can be used) for the duration of the COVID-19 declaration under Section 564(b)(1) of the Act, 21 U.S.C. section 360bbb-3(b)(1), unless the authorization is terminated or revoked sooner.  Performed at [facility], [address] [place], [place] [address]   MRSA PCR Screening     Status: None   Collection Time: 06/09/20 12:28 PM   Specimen: Nasal Mucosa; Nasopharyngeal  Result Value Ref Range Status   MRSA by PCR NEGATIVE NEGATIVE Final    Comment:        The GeneXpert MRSA Assay (FDA approved for NASAL specimens only), is one component of a comprehensive MRSA colonization surveillance program. It is not intended to diagnose MRSA infection nor to guide or monitor treatment for MRSA infections. Performed at [facility], [address] [place], [place] [address]          Radiology Studies: DG Chest [place] 1 View  Result Date: 06/08/2020 CLINICAL DATA:  The patient suffered a left hip fracture in a fall  today. Preoperative exam. EXAM: PORTABLE CHEST 1 VIEW COMPARISON:  PA and lateral chest 05/15/2020. FINDINGS: The lungs are hyperexpanded but clear. Heart size is normal. Aortic atherosclerosis. No pneumothorax or pleural effusion. Pacing device is in place. IMPRESSION: No acute disease. Aortic Atherosclerosis (ICD10-I70.0) and Emphysema (ICD10-J43.9). Electronically Signed   By: Drusilla Kanner M.D.   On: 06/08/2020 16:34   DG Knee Complete 4 Views Left  Result Date: 06/08/2020 CLINICAL DATA:  Pain. Patient with dementia. Unknown trauma history. EXAM: LEFT KNEE - COMPLETE 4+ VIEW COMPARISON:  None. FINDINGS: No acute fracture or dislocation. No joint effusion. Mild osteopenia. Joint spaces are maintained for age. Mild degenerate sclerosis involves the femoral condyles. IMPRESSION: No acute osseous abnormality. Electronically Signed   By: Jeronimo Greaves M.D.   On: 06/08/2020 11:10   DG Hip Unilat W or Wo Pelvis 2-3 Views Left  Result Date: 06/08/2020 CLINICAL DATA:  Knee pain. Unable to bear weight. EXAM: DG HIP (WITH OR WITHOUT PELVIS) 2-3V LEFT COMPARISON:  None. FINDINGS: There is a impacted sub capitellar displaced left femoral neck fracture with superior displacement of the distal fracture fragment. The acetabulum in the visualized pelvic bones are intact. Constipation. IMPRESSION: Impacted sub capitellar displaced left femoral neck fracture. Electronically Signed   By: Ted Mcalpine M.D.   On: 06/08/2020 12:29        Scheduled Meds: . acetaminophen  1,000 mg Oral Once  . [MAR Hold] ALPRAZolam  0.5 mg Oral BID  . [MAR Hold] celecoxib  100 mg Oral Daily  . chlorhexidine  60 mL Topical Once  . [MAR Hold] divalproex  125 mg Oral BID  . [MAR Hold] escitalopram  10 mg Oral Daily  . [MAR Hold] levothyroxine  75 mcg Oral QAC breakfast  . [MAR Hold] pantoprazole  40 mg Oral Daily  . povidone-iodine  2 application Topical Once  . [MAR Hold] QUEtiapine  25 mg Oral QHS   Continuous  Infusions: . ceFAZolin    . lactated ringers       LOS: 1 day    Time spent: 25 minutes    Alberteen Sam, MD [facility] 06/09/2020, 4:31 PM     Please page though AMION or Epic secure chat:  For [employer], Higher education careers adviser

## 2020-06-09 NOTE — Anesthesia Preprocedure Evaluation (Addendum)
Anesthesia Evaluation  Patient identified by MRN, date of birth, ID band Patient unresponsive    Reviewed: Allergy & Precautions, NPO status , Patient's Chart, lab work & pertinent test results, Unable to perform ROS - Chart review only  History of Anesthesia Complications Negative for: history of anesthetic complications  Airway Mallampati: II  TM Distance: >3 FB Neck ROM: Full    Dental no notable dental hx. (+) Dental Advisory Given   Pulmonary neg pulmonary ROS, former smoker,    Pulmonary exam normal        Cardiovascular Normal cardiovascular exam+ pacemaker      Neuro/Psych Dementia negative neurological ROS     GI/Hepatic Neg liver ROS, GERD  ,  Endo/Other  Hypothyroidism   Renal/GU negative Renal ROS  negative genitourinary   Musculoskeletal negative musculoskeletal ROS (+)   Abdominal   Peds negative pediatric ROS (+)  Hematology negative hematology ROS (+)   Anesthesia Other Findings   Reproductive/Obstetrics negative OB ROS                            Anesthesia Physical Anesthesia Plan  ASA: III  Anesthesia Plan: General   Post-op Pain Management:    Induction: Intravenous  PONV Risk Score and Plan: 3 and Ondansetron, Dexamethasone and Treatment may vary due to age or medical condition  Airway Management Planned: Oral ETT  Additional Equipment:   Intra-op Plan:   Post-operative Plan: Extubation in OR  Informed Consent: I have reviewed the patients History and Physical, chart, labs and discussed the procedure including the risks, benefits and alternatives for the proposed anesthesia with the patient or authorized representative who has indicated his/her understanding and acceptance.     Dental advisory given and Consent reviewed with POA  Plan Discussed with: CRNA and Anesthesiologist  Anesthesia Plan Comments: (Discussed with her daughter, Sandria Bales [phone])      Anesthesia Quick Evaluation

## 2020-06-10 DIAGNOSIS — E559 Vitamin D deficiency, unspecified: Secondary | ICD-10-CM | POA: Diagnosis present

## 2020-06-10 LAB — BASIC METABOLIC PANEL
Anion gap: 17 — ABNORMAL HIGH (ref 5–15)
BUN: 23 mg/dL (ref 8–23)
CO2: 22 mmol/L (ref 22–32)
Calcium: 8.3 mg/dL — ABNORMAL LOW (ref 8.9–10.3)
Chloride: 99 mmol/L (ref 98–111)
Creatinine, Ser: 1.19 mg/dL — ABNORMAL HIGH (ref 0.44–1.00)
GFR calc Af Amer: 47 mL/min — ABNORMAL LOW (ref >60)
GFR calc non Af Amer: 41 mL/min — ABNORMAL LOW (ref >60)
Glucose, Bld: 202 mg/dL — ABNORMAL HIGH (ref 70–99)
Potassium: 3.7 mmol/L (ref 3.5–5.1)
Sodium: 138 mmol/L (ref 135–145)

## 2020-06-10 LAB — CBC
HCT: 31.2 % — ABNORMAL LOW (ref 36.0–46.0)
Hemoglobin: 9.8 g/dL — ABNORMAL LOW (ref 12.0–15.0)
MCH: 28.1 pg (ref 26.0–34.0)
MCHC: 31.4 g/dL (ref 30.0–36.0)
MCV: 89.4 fL (ref 80.0–100.0)
Platelets: 256 10*3/uL (ref 150–400)
RBC: 3.49 MIL/uL — ABNORMAL LOW (ref 3.87–5.11)
RDW: 14.1 % (ref 11.5–15.5)
WBC: 12.2 10*3/uL — ABNORMAL HIGH (ref 4.0–10.5)
nRBC: 0 % (ref 0.0–0.2)

## 2020-06-10 LAB — CREATININE, SERUM
Creatinine, Ser: 1.11 mg/dL — ABNORMAL HIGH (ref 0.44–1.00)
GFR calc Af Amer: 51 mL/min — ABNORMAL LOW (ref >60)
GFR calc non Af Amer: 44 mL/min — ABNORMAL LOW (ref >60)

## 2020-06-10 LAB — VITAMIN D 25 HYDROXY (VIT D DEFICIENCY, FRACTURES): Vit D, 25-Hydroxy: 20.89 ng/mL — ABNORMAL LOW (ref 30–100)

## 2020-06-10 MED ORDER — LACTATED RINGERS IV BOLUS
1000.0000 mL | Freq: Once | INTRAVENOUS | Status: AC
  Administered 2020-06-10: 1000 mL via INTRAVENOUS

## 2020-06-10 MED ORDER — ENOXAPARIN SODIUM 30 MG/0.3ML ~~LOC~~ SOLN
30.0000 mg | SUBCUTANEOUS | Status: DC
  Administered 2020-06-10 – 2020-06-13 (×4): 30 mg via SUBCUTANEOUS
  Filled 2020-06-10 (×4): qty 0.3

## 2020-06-10 MED ORDER — HYDROMORPHONE HCL 1 MG/ML IJ SOLN: 0.5000 mg | INTRAMUSCULAR | Status: DC | PRN

## 2020-06-10 MED ORDER — OXYCODONE HCL 5 MG PO TABS
5.0000 mg | ORAL_TABLET | ORAL | Status: DC | PRN
  Administered 2020-06-11: 5 mg via ORAL
  Filled 2020-06-10 (×2): qty 1

## 2020-06-10 NOTE — Progress Notes (Addendum)
PROGRESS NOTE    Laurie Humphrey  ERX:[id number] DOB: Jun 30, 1929 DOA: 06/08/2020 PCP: [name], MD      Brief Narrative:  Laurie Humphrey is a 86 y.o. F with dementia lives in memory care, CHB s/p pacemaker, hypothyroidism and osteoporosis who presented with LEFT hip pain after a fall.  Patient reportedly falls frequently.  The morning of admission, would not bear weight (she normally ambulates often without assistance) and so was sent to the ER.  In the ER, pelvis radiograph showed a LEFT femoral neck fracture.  Case discussed with Orthopedics, who recommended operative repair.         Assessment & Plan:  Acute LEFT femoral neck fracture S/p LEFT unipolar hemiarthroplasty by Dr. Carola Frost on 7/19 Admitted and evaluated by Othopedics who recommended hip replacement, performed 7/19, with 200cc EBL and no complications.     Acute metabolic encephalopathy due to over-medication This AM patient somnolent, BP soft, mildly hypothermic after being given morphine, trazodone and Xanax overnight. -Discontinue Xanax -No IV pain medication -Avoid trazodone  Dementia -Continue Depakote, escitalopram, Seroquel -Hold Xanax  Hypothyroidism -Continue levothyroxine  Complete heart block s/p pacemaker  Chronic normocytic anemia, anemia of chronic disease 200 cc EBL during surgery, hemoglobin this morning is stable   CKD IIIa Cr 1.2 today, near basleine 1.1        Disposition: Status is: Inpatient  Remains inpatient appropriate because: The patient has undergone left hip replacement, she will need PT evaluation, close monitoring for complications postoperative due to overall declining health and mobility.  She will need placement for skilled nursing rehab   Dispo: The patient is from: ALF              Anticipated d/c is to: SNF              Anticipated d/c date is: 2 days              Patient currently is not medically stable to d/c.              MDM: The  below labs and imaging reports reviewed and summarized above.  Medication management as above.     DVT prophylaxis: enoxaparin (LOVENOX) injection 40 mg Start: 06/10/20 0900 SCDs Start: 06/09/20 2057 Place and maintain sequential compression device Start: 06/09/20 0829  Code Status: FULL Family Communication: None present at time of roudning, will call daughter this afternoon    Consultants:   Orthopedics  Procedures:   7/19 hip unipolar hemiarthroplasty  Antimicrobials:      Culture data:              Subjective: Patient given morphine, Xanax, trazodone overnight and this morning is sedated.  No new dysuria, fever, respiratory symptoms, cough.  Objective: Vitals:   06/09/20 2048 06/10/20 0155 06/10/20 0623 06/10/20 0638  BP: 119/64 110/64 (!) 96/48 (!) 95/48  Pulse: 69 73 62 67  Resp: 17 17 16 16   Temp: (!) 97.5 F (36.4 C) (!) 97.5 F (36.4 C) (!) 97.4 F (36.3 C) (!) 97.4 F (36.3 C)  TempSrc: Oral Oral Oral Oral  SpO2: 99% 98% 92% 96%  Weight:      Height:        Intake/Output Summary (Last 24 hours) at 06/10/2020 0807 Last data filed at 06/10/2020 0634 Gross per 24 hour  Intake 854.84 ml  Output 400 ml  Net 454.84 ml   Filed Weights   06/08/20 0927  Weight: 49.9 kg    Examination:  General appearance: Thin elderly female, lying in bed, no acute distress, somnolent     HEENT: Anicteric, conjunctival pink, lids and lashes normal.  No nasal deformity, discharge, or epistaxis. Skin: Skin pale, warm and dry, no suspicious rashes or lesions. Cardiac: RRR, no murmurs, no lower extremity edema Respiratory: Respirations slow, unlabored, lungs clear without rales or wheezes Abdomen: Abdomen soft without tenderness palpation or guarding MSK:  Neuro: Somnolent, arouses to stimuli, makes one-word answers, makes eye contact attempts to move arms weakly, but symmetrically.  Speech fluent.  Gait is normal Psych: Somnolent, attention diminished, affect  blunted      Data Reviewed: I have personally reviewed following labs and imaging studies:  CBC: Recent Labs  Lab 06/08/20 1540 06/09/20 2313 06/10/20 0207  WBC 7.6 11.1* 12.2*  NEUTROABS 5.6  --   --   HGB 10.6* 10.0* 9.8*  HCT 33.8* 32.0* 31.2*  MCV 91.1 90.7 89.4  PLT 227 232 256   Basic Metabolic Panel: Recent Labs  Lab 06/08/20 1540 06/09/20 2313 06/10/20 0207  NA 139  --  138  K 4.0  --  3.7  CL 102  --  99  CO2 27  --  22  GLUCOSE 98  --  202*  BUN 22  --  23  CREATININE 1.04* 1.11* 1.19*  CALCIUM 8.2*  --  8.3*   GFR: Estimated Creatinine Clearance: 25.7 mL/min (A) (by C-G formula based on SCr of 1.19 mg/dL (H)). Liver Function Tests: No results for input(s): AST, ALT, ALKPHOS, BILITOT, PROT, ALBUMIN in the last 168 hours. No results for input(s): LIPASE, AMYLASE in the last 168 hours. No results for input(s): AMMONIA in the last 168 hours. Coagulation Profile: No results for input(s): INR, PROTIME in the last 168 hours. Cardiac Enzymes: No results for input(s): CKTOTAL, CKMB, CKMBINDEX, TROPONINI in the last 168 hours. BNP (last 3 results) No results for input(s): PROBNP in the last 8760 hours. HbA1C: No results for input(s): HGBA1C in the last 72 hours. CBG: No results for input(s): GLUCAP in the last 168 hours. Lipid Profile: No results for input(s): CHOL, HDL, LDLCALC, TRIG, CHOLHDL, LDLDIRECT in the last 72 hours. Thyroid Function Tests: No results for input(s): TSH, T4TOTAL, FREET4, T3FREE, THYROIDAB in the last 72 hours. Anemia Panel: No results for input(s): VITAMINB12, FOLATE, FERRITIN, TIBC, IRON, RETICCTPCT in the last 72 hours. Urine analysis:    Component Value Date/Time   COLORURINE YELLOW 06/08/2020 2326   APPEARANCEUR HAZY (A) 06/08/2020 2326   LABSPEC 1.017 06/08/2020 2326   PHURINE 7.0 06/08/2020 2326   GLUCOSEU NEGATIVE 06/08/2020 2326   HGBUR NEGATIVE 06/08/2020 2326   BILIRUBINUR NEGATIVE 06/08/2020 2326   KETONESUR 5  (A) 06/08/2020 2326   PROTEINUR NEGATIVE 06/08/2020 2326   NITRITE NEGATIVE 06/08/2020 2326   LEUKOCYTESUR NEGATIVE 06/08/2020 2326   Sepsis Labs: @LABRCNTIP (procalcitonin:4,lacticacidven:4)  ) Recent Results (from the past 240 hour(s))  SARS Coronavirus 2 by RT PCR (hospital order, performed in [facility] hospital lab) Nasopharyngeal Nasopharyngeal Swab     Status: None   Collection Time: 06/08/20  2:22 PM   Specimen: Nasopharyngeal Swab  Result Value Ref Range Status   SARS Coronavirus 2 NEGATIVE NEGATIVE Final    Comment: (NOTE) SARS-CoV-2 target nucleic acids are NOT DETECTED.  The SARS-CoV-2 RNA is generally detectable in upper and lower respiratory specimens during the acute phase of infection. The lowest concentration of SARS-CoV-2 viral copies this assay can detect is 250 copies / mL. A negative result does not  preclude SARS-CoV-2 infection and should not be used as the sole basis for treatment or other patient management decisions.  A negative result may occur with improper specimen collection / handling, submission of specimen other than nasopharyngeal swab, presence of viral mutation(s) within the areas targeted by this assay, and inadequate number of viral copies (<250 copies / mL). A negative result must be combined with clinical observations, patient history, and epidemiological information.  Fact Sheet for Patients:   BoilerBrush.com.cyhttps://www.fda.gov/media/136312/download  Fact Sheet for Healthcare Providers: https://pope.com/https://www.fda.gov/media/136313/download  This test is not yet approved or  cleared by the [place] FDA and has been authorized for detection and/or diagnosis of SARS-CoV-2 by FDA under an Emergency Use Authorization (EUA).  This EUA will remain in effect (meaning this test can be used) for the duration of the COVID-19 declaration under Section 564(b)(1) of the Act, 21 U.S.C. section 360bbb-3(b)(1), unless the authorization is terminated or revoked  sooner.  Performed at [facility], [address] [place], [place] [address]   MRSA PCR Screening     Status: None   Collection Time: 06/09/20 12:28 PM   Specimen: Nasal Mucosa; Nasopharyngeal  Result Value Ref Range Status   MRSA by PCR NEGATIVE NEGATIVE Final    Comment:        The GeneXpert MRSA Assay (FDA approved for NASAL specimens only), is one component of a comprehensive MRSA colonization surveillance program. It is not intended to diagnose MRSA infection nor to guide or monitor treatment for MRSA infections. Performed at [facility], [address] [place], [place] [address]          Radiology Studies: DG Chest GalisteoPort 1 View  Result Date: 06/08/2020 CLINICAL DATA:  The patient suffered a left hip fracture in a fall today. Preoperative exam. EXAM: PORTABLE CHEST 1 VIEW COMPARISON:  PA and lateral chest 05/15/2020. FINDINGS: The lungs are hyperexpanded but clear. Heart size is normal. Aortic atherosclerosis. No pneumothorax or pleural effusion. Pacing device is in place. IMPRESSION: No acute disease. Aortic Atherosclerosis (ICD10-I70.0) and Emphysema (ICD10-J43.9). Electronically Signed   By: Drusilla Kannerhomas  Dalessio M.D.   On: 06/08/2020 16:34   DG Knee Complete 4 Views Left  Result Date: 06/08/2020 CLINICAL DATA:  Pain. Patient with dementia. Unknown trauma history. EXAM: LEFT KNEE - COMPLETE 4+ VIEW COMPARISON:  None. FINDINGS: No acute fracture or dislocation. No joint effusion. Mild osteopenia. Joint spaces are maintained for age. Mild degenerate sclerosis involves the femoral condyles. IMPRESSION: No acute osseous abnormality. Electronically Signed   By: Jeronimo GreavesKyle  Talbot M.D.   On: 06/08/2020 11:10   DG HIP PORT UNILAT WITH PELVIS 1V LEFT  Result Date: 06/09/2020 CLINICAL DATA:  Status post left hip hemiarthroplasty EXAM: DG HIP (WITH OR WITHOUT PELVIS) 1V PORT LEFT COMPARISON:  None. FINDINGS: Status post left hip hemiarthroplasty. There is postoperative gas  surrounding the proximal femur. Prosthetic components are in expected position. IMPRESSION: Expected postoperative appearance of left hip hemiarthroplasty. Electronically Signed   By: Deatra RobinsonKevin  Herman M.D.   On: 06/09/2020 20:23   DG Hip Unilat W or Wo Pelvis 2-3 Views Left  Result Date: 06/08/2020 CLINICAL DATA:  Knee pain. Unable to bear weight. EXAM: DG HIP (WITH OR WITHOUT PELVIS) 2-3V LEFT COMPARISON:  None. FINDINGS: There is a impacted sub capitellar displaced left femoral neck fracture with superior displacement of the distal fracture fragment. The acetabulum in the visualized pelvic bones are intact. Constipation. IMPRESSION: Impacted sub capitellar displaced left femoral neck fracture. Electronically Signed   By:  Ted Mcalpine M.D.   On: 06/08/2020 12:29        Scheduled Meds: . acetaminophen  500 mg Oral Q8H  . divalproex  125 mg Oral BID  . docusate sodium  100 mg Oral BID  . enoxaparin (LOVENOX) injection  40 mg Subcutaneous Q24H  . escitalopram  10 mg Oral Daily  . levothyroxine  75 mcg Oral QAC breakfast  . pantoprazole  40 mg Oral Daily  . QUEtiapine  25 mg Oral QHS   Continuous Infusions: . lactated ringers       LOS: 2 days    Time spent: 25 minutes    Alberteen Sam, MD [facility] 06/10/2020, 8:07 AM     Please page though AMION or Epic secure chat:  For [employer], Higher education careers adviser

## 2020-06-10 NOTE — TOC CAGE-AID Note (Signed)
Transition of Care [facility]) - CAGE-AID Screening   Patient Details  Name: Laurie Humphrey MRN: [id number] Date of Birth: 05-23-1929  [facility] CM/SW Contact:    Jimmy Picket, LCSWA Phone Number: 06/10/2020, 12:28 PM   Clinical Narrative:  Pt was unable to participate in assessment due to pt not being oriented.  CAGE-AID Screening: Substance Abuse Screening unable to be completed due to: : Patient unable to participate               Isabella Stalling Clinical Social Worker [phone]

## 2020-06-10 NOTE — Evaluation (Signed)
Physical Therapy Evaluation Patient Details Name: Laurie Humphrey MRN: [id number] DOB: Aug 05, 1929 Today's Date: 06/10/2020   History of Present Illness  Pt is an 86 yo female s/p knee pain and inability to bear wt on LLE, s/p fall history requiring L hemiarthroplasty. PMHx: multiple falls, osteoporosis, dementia. Pt came from ALF.  Clinical Impression  Patient presents with lethargy and post surgical deficits s/p above surgery. Pt is from [facility] memory care facility and per notes, pt was independent with ambulation PTA. Likely required assist with ADLs as pt had a caregiver. Today, pt requires Mod-Max A for bed mobility and Mod A to stand with use of RW. Able to side step along side bed with RW for support and Min A. Deferred further mobility due to decreased attention/arousal re: affects of pain meds. Pt with posterior bias sitting EOB and in standing, appears fearful of falling. Would benefit from post acute rehab to maximize independence and mobility prior to return to memory care facility unless they are able to provide necessary assist level. Will follow acutely.     Follow Up Recommendations SNF;Supervision for mobility/OOB;Supervision/Assistance - 24 hour    Equipment Recommendations  Rolling walker with 5" wheels    Recommendations for Other Services       Precautions / Restrictions Precautions Precautions: Fall;Posterior Hip Precaution Booklet Issued: No Precaution Comments: hx of falls, pt too lethargic to comprehend posterior hip precautions Required Braces or Orthoses: Knee Immobilizer - Left Restrictions Weight Bearing Restrictions: Yes LLE Weight Bearing: Weight bearing as tolerated      Mobility  Bed Mobility Overal bed mobility: Needs Assistance Bed Mobility: Supine to Sit;Sit to Supine     Supine to sit: Mod assist;HOB elevated Sit to supine: Max assist;+2 for physical assistance   General bed mobility comments: assist with LLE, scooting bottom and with  trunk to get to EOB. posterior bias. Cues for sequencing and to reach for rail. Assist to bring LEs into bed to return to supine.  Transfers Overall transfer level: Needs assistance Equipment used: Rolling walker (2 wheeled) Transfers: Sit to/from Stand Sit to Stand: Mod assist;+2 safety/equipment         General transfer comment: Assist to power to standing with cues for hand placement.  Ambulation/Gait Ambulation/Gait assistance: Min assist;+2 safety/equipment Gait Distance (Feet): 3 Feet Assistive device: Rolling walker (2 wheeled) Gait Pattern/deviations: Trunk flexed;Shuffle Gait velocity: decreased   General Gait Details: Able to side step along side bed with Min A for balance/RW management.  Stairs            Wheelchair Mobility    Modified Rankin (Stroke Patients Only)       Balance Overall balance assessment: Needs assistance;History of Falls Sitting-balance support: Feet supported;Bilateral upper extremity supported Sitting balance-Leahy Scale: Poor Sitting balance - Comments: Min A for sitting balance, posterior bias. Able to maintain static sitting balance for moments with Min guard. Postural control: Posterior lean Standing balance support: During functional activity Standing balance-Leahy Scale: Poor Standing balance comment: Requires UE support and Min A for balance. Posterior bias.                             Pertinent Vitals/Pain Pain Assessment: Faces Faces Pain Scale: Hurts a little bit Pain Location: left hip with movement Pain Descriptors / Indicators: Grimacing;Guarding;Operative site guarding;Sore Pain Intervention(s): Monitored during session;Repositioned;Premedicated before session    Home Living Family/patient expects to be discharged to:: Other (Comment) [facility]  memory care)                      Prior Function Level of Independence: Needs assistance   Gait / Transfers Assistance Needed: Per notes, pt is  ambulatory independently but does not lots of falls.  ADL's / Homemaking Assistance Needed: Has a caregiver at memory care facility that assists with ADLs.        Hand Dominance        Extremity/Trunk Assessment   Upper Extremity Assessment Upper Extremity Assessment: Defer to OT evaluation    Lower Extremity Assessment Lower Extremity Assessment: LLE deficits/detail;Difficult to assess due to impaired cognition;Generalized weakness LLE Deficits / Details: Able to perform LAQ and mobilize ankle.       Communication   Communication: No difficulties  Cognition Arousal/Alertness: Lethargic;Suspect due to medications Behavior During Therapy: [facility] for tasks assessed/performed Overall Cognitive Status: History of cognitive impairments - at baseline                                 General Comments: Oriented to self only today. Follows commands. Pleasant. Hx of dementia.      General Comments      Exercises     Assessment/Plan    PT Assessment Patient needs continued PT services  PT Problem List Decreased strength;Decreased mobility;Decreased safety awareness;Decreased knowledge of precautions;Decreased range of motion;Decreased cognition;Decreased balance;Decreased knowledge of use of DME;Decreased activity tolerance       PT Treatment Interventions DME instruction;Therapeutic activities;Gait training;Therapeutic exercise;Patient/family education;Balance training;Functional mobility training    PT Goals (Current goals can be found in the Care Plan section)  Acute Rehab PT Goals Patient Stated Goal: none stated, pleasant PT Goal Formulation: Patient unable to participate in goal setting Time For Goal Achievement: 06/24/20 Potential to Achieve Goals: Good    Frequency Min 3X/week   Barriers to discharge Decreased caregiver support      Co-evaluation PT/OT/SLP Co-Evaluation/Treatment: Yes Reason for Co-Treatment: Necessary to address  cognition/behavior during functional activity;To address functional/ADL transfers;For patient/therapist safety PT goals addressed during session: Mobility/safety with mobility;Balance;Strengthening/ROM         AM-PAC PT "6 Clicks" Mobility  Outcome Measure Help needed turning from your back to your side while in a flat bed without using bedrails?: A Little Help needed moving from lying on your back to sitting on the side of a flat bed without using bedrails?: A Lot Help needed moving to and from a bed to a chair (including a wheelchair)?: A Lot Help needed standing up from a chair using your arms (e.g., wheelchair or bedside chair)?: A Lot Help needed to walk in hospital room?: A Lot Help needed climbing 3-5 steps with a railing? : Total 6 Click Score: 12    End of Session Equipment Utilized During Treatment: Gait belt Activity Tolerance: Patient tolerated treatment well;Patient limited by lethargy Patient left: in bed;with call bell/phone within reach;with bed alarm set;with SCD's reapplied;with nursing/sitter in room Nurse Communication: Mobility status PT Visit Diagnosis: Unsteadiness on feet (R26.81);Difficulty in walking, not elsewhere classified (R26.2)    Time: 8921-1941 PT Time Calculation (min) (ACUTE ONLY): 22 min   Charges:   PT Evaluation $PT Eval Moderate Complexity: 1 Mod          Vale Haven, PT, DPT Acute Rehabilitation Services Pager [phone] Office [phone]      Laurie Humphrey 06/10/2020, 11:22 AM

## 2020-06-10 NOTE — Social Work (Addendum)
11:31am- CSW left additional voicemail requesting call back from pt daughter. I spoke with Olegario Messier at [facility] [phone], fax [phone]), they are willing to review pt for return with [facility] if family so desires. PT recommending SNF if cannot return safely to ALF [facility]).   10:49am- HIPAA compliant message left for pt daughter [name] at [phone].   Will f/u later this afternoon if no return call.  Octavio Graves, MSW, LCSW [facility] Clinical Social Work

## 2020-06-10 NOTE — Evaluation (Signed)
Clinical/Bedside Swallow Evaluation Patient Details  Name: Laurie Humphrey MRN: [id number] Date of Birth: [birth year]  Today's Date: 06/10/2020 Time: SLP Start Time (ACUTE ONLY): 3532 SLP Stop Time (ACUTE ONLY): 0950 SLP Time Calculation (min) (ACUTE ONLY): 12 min  Past Medical History:  Past Medical History:  Diagnosis Date  . Goiter, non-toxic   . Heart block    Pacemaker sept 2017  . Hypothyroidism   . Osteoporosis    Past Surgical History:  Past Surgical History:  Procedure Laterality Date  . BIOPSY  06/07/2017   Procedure: BIOPSY;  Surgeon: West Bali, MD;  [facility]: [facility];  Service: Endoscopy;;  gastric  . ESOPHAGOGASTRODUODENOSCOPY N/A 06/07/2017   Procedure: ESOPHAGOGASTRODUODENOSCOPY (EGD);  Surgeon: West Bali, MD;  Location: [facility];  Service: Endoscopy;  Laterality: N/A;  2:15pm  . PACEMAKER IMPLANT  07/2016   [facility]  . THYROID SURGERY     HPI:  Laurie Humphrey is a 86 y.o. F with dementia lives in memory care, CHB s/p pacemaker, hypothyroidism and osteoporosis who presented with LEFT hip pain after a fall. Easily sedated with pain medication, impacting PO intake.   Assessment / Plan / Recommendation Clinical Impression  Laurie Humphrey was asleep in bed upon SLP arrival. She required mod cues to arouse for PO intake. Pt with unremarkable oral mech exam. She denies dysphagia. She had no s/s aspiration given any consistency (ice, thin, puree, solid), but was noted with absent mastication of solid, despite cues, requiring manual removal by SLP. Oral dysphagia appears to be directly related to level of alertness. Suspect once patient is alert, she would be able to upgrade to a regular diet, but at this time, recommend pureed solids (Dys 1) and thin liquids with full assist for all PO intake, including verbal cues to swallow and remain alert. Meds crushed, if permissible, and placed in puree. Check for oral holding.  ST service to follow for diet upgrade once  patient is more alert.   SLP Visit Diagnosis: Dysphagia, oral phase (R13.11)    Aspiration Risk  Mild aspiration risk    Diet Recommendation Dysphagia 1 (Puree);Thin liquid   Liquid Administration via: Cup;Straw Medication Administration: Crushed with puree Supervision: Staff to assist with self feeding Compensations: Small sips/bites Postural Changes: Seated upright at 90 degrees    Other  Recommendations Oral Care Recommendations: Oral care BID   Follow up Recommendations None      Frequency and Duration min 1 x/week  1 week       Prognosis Prognosis for Safe Diet Advancement: Good      Swallow Study   General Date of Onset: 06/09/20 HPI: Laurie Humphrey is a 86 y.o. F with dementia lives in memory care, CHB s/p pacemaker, hypothyroidism and osteoporosis who presented with LEFT hip pain after a fall. Easily sedated with pain medication, impacting PO intake. Type of Study: Bedside Swallow Evaluation Previous Swallow Assessment: n/a Diet Prior to this Study: Regular;Thin liquids Temperature Spikes Noted: No Respiratory Status: Room air History of Recent Intubation: No Behavior/Cognition: Cooperative;Lethargic/Drowsy Oral Cavity Assessment: Within Functional Limits Oral Care Completed by SLP: Recent completion by staff Oral Cavity - Dentition: Adequate natural dentition Vision: Functional for self-feeding Self-Feeding Abilities: Needs assist Patient Positioning: Upright in bed Baseline Vocal Quality: Normal Volitional Cough: Weak Volitional Swallow: Able to elicit    Oral/Motor/Sensory Function Overall Oral Motor/Sensory Function: Within functional limits   Ice Chips Ice chips: Within functional limits Presentation: Spoon   Thin Liquid Thin Liquid: Within  functional limits Presentation: Cup;Spoon;Straw    Nectar Thick   Not tested  Honey Thick   Not tested  Puree Puree: Within functional limits Presentation: Spoon   Solid     Solid: Impaired Oral Phase  Functional Implications: Oral holding     Arrington Bencomo P. Brunette Lavalle, M.S., CCC-SLP Speech-Language Pathologist Acute Rehabilitation Services Pager: [phone]  [place] Per[place] P Therman Hughlett 06/10/2020,9:58 AM

## 2020-06-10 NOTE — Progress Notes (Signed)
PT Cancellation Note  Patient Details Name: Laurie Humphrey MRN: [id number] DOB: [birth year]   Cancelled Treatment:    Reason Eval/Treat Not Completed: Patient's level of consciousness Holding PT eval this AM as pt sleeping heavily due to being given morphine. Will follow.   [name] 06/10/2020, 7:49 AM [place], PT, DPT Acute Rehabilitation Services Pager [phone] Office [phone]

## 2020-06-10 NOTE — Progress Notes (Signed)
   06/10/20 8338  Assess: MEWS Score  Temp (!) 97.4 F (36.3 C)  BP (!) 95/48  Pulse Rate 67  Resp 16  SpO2 96 %  Assess: MEWS Score  MEWS Temp 0  MEWS Systolic 1  MEWS Pulse 0  MEWS RR 0  MEWS LOC 1  MEWS Score 2  MEWS Score Color Yellow  Assess: if the MEWS score is Yellow or Red  Were vital signs taken at a resting state? Yes  Focused Assessment Change from prior assessment (see assessment flowsheet)  Early Detection of Sepsis Score *See Row Information* Low  MEWS guidelines implemented *See Row Information* Yes  Treat  MEWS Interventions Escalated (See documentation below)  Pain Scale PAINAD  Breathing 0  Negative Vocalization 0  Facial Expression 0  Body Language 0  Consolability 0  PAINAD Score 0  Take Vital Signs  Increase Vital Sign Frequency  Yellow: Q 2hr X 2 then Q 4hr X 2, if remains yellow, continue Q 4hrs  Escalate  MEWS: Escalate Yellow: discuss with charge nurse/RN and consider discussing with provider and RRT  Notify: Charge Nurse/RN  Name of Charge Nurse/RN Notified Kristi RN  Date Charge Nurse/RN Notified 06/10/20  Time Charge Nurse/RN Notified 0715  Notify: Provider  Provider Name/Title Joen Laura MD  Date Provider Notified 06/10/20  Time Provider Notified [phone]  Notification Type Page  Notification Reason Change in status  Response No new orders  Pt's BP soft and Temperature slightly below parameters 97.4 this a.m. during assessment.  Pt moderately sedated responsive to voice and painful stimuli.  Pt asymptomatic otherwise s/p L-hip arthroplasty 7/19.  Heating blankets placed for improved core temperature and room temperature increased in pt's room.  Charge Nurse and MD notified of clinical situation awaiting call back from MD.  Pt is resting with no s/s of acute distress.  Will con't to monitor.

## 2020-06-10 NOTE — TOC Initial Note (Signed)
Transition of Care [facility]) - Initial/Assessment Note    Patient Details  Name: Laurie Humphrey MRN: [id number] Date of Birth: [birth year]  Transition of Care [facility]) CM/SW Contact:    Doy Hutching, LCSW Phone Number: 06/10/2020, 3:43 PM  Clinical Narrative:                 CSW spoke with pt daughter [name] at bedside. Introduced self, role, reason for visit. Confirmed pt from [facility], has been there for about 2 years. She has had several falls of late but usually walks around the memory care. Pt daughter interested in SNF at [facility] if possible as it is close to her house, she will be okay if further referral needs to be made and CSW will reach out if [facility] cannot offer.   Voicemail left with [facility] admissions liaison Misty to review pt. Pt has had COVID vaccines per daughter.  Expected Discharge Plan: Skilled Nursing Facility Barriers to Discharge: Continued Medical Work up   Patient Goals and CMS Choice Patient states their goals for this hospitalization and ongoing recovery are:: get stronger to return to memory care CMS Medicare.gov Compare Post Acute Care list provided to:: Patient Represenative (must comment) (pt daughter) Choice offered to / list presented to : Adult Children  Expected Discharge Plan and Services Expected Discharge Plan: Skilled Nursing Facility In-house Referral: Clinical Social Work Discharge Planning Services: CM Consult Post Acute Care Choice: Skilled Nursing Facility Living arrangements for the past 2 months: Assisted Living Facility  Prior Living Arrangements/Services Living arrangements for the past 2 months: Assisted Living Facility Lives with:: Facility Resident Patient language and need for interpreter reviewed:: Yes (no needs) Do you feel safe going back to the place where you live?: No   prefer SNF placement at d/c  Need for Family Participation in Patient Care: Yes (Comment) (assistance with daily cares) Care giver support  system in place?: Yes (comment) Current home services: DME Criminal Activity/Legal Involvement Pertinent to Current Situation/Hospitalization: No - Comment as needed  Permission Sought/Granted Permission sought to share information with : Family Supports Permission granted to share information with : No (pt only oriented to self)  Share Information with NAME: Sandria Bales  Permission granted to share info w AGENCY: SNFs- preference for [employer]  Permission granted to share info w Relationship: daughter  Permission granted to share info w Contact Information: [phone]  Emotional Assessment Appearance:: Appears stated age Attitude/Demeanor/Rapport: Unable to Assess (due to mental status) Affect (typically observed): Unable to Assess (due to mental status) Orientation: : Oriented to Self, Fluctuating Orientation (Suspected and/or reported Sundowners) Alcohol / Substance Use: Not Applicable Psych Involvement: No (comment)  Admission diagnosis:  Hip fracture (HCC) [S72.009A] Closed fracture of left hip, initial encounter [facility]) [S72.002A] Patient Active Problem List   Diagnosis Date Noted   Hip fracture (HCC) 06/08/2020   NSVT (nonsustained ventricular tachycardia) (HCC) 09/29/2017   LUQ pain 05/27/2017   CHB (complete heart block) (HCC) 08/13/2016   Hypothyroidism 09/01/2015   Hypokalemia 05/31/2014   GERD (gastroesophageal reflux disease) 05/31/2014   Chest pain 05/31/2014   PCP:  [name], MD Pharmacy:   [facility], [place] - [address] [place] [address] Phone: [phone] Fax: [phone]     Readmission Risk Interventions Readmission Risk Prevention Plan 06/10/2020  Post Dischage Appt Not Complete  Appt Comments facility resident  Medication Screening Complete  Transportation Screening Complete  Some recent data might be hidden

## 2020-06-10 NOTE — NC FL2 (Addendum)
[place] MEDICAID FL2 LEVEL OF CARE SCREENING TOOL     IDENTIFICATION  Patient Name: Laurie Humphrey Birthdate: 08-23-1929 Sex: female Admission Date (Current Location): 06/08/2020  [facility] and [place] Number:  [employer] and Address:  [facility], [address] [place], [place] [address]      Provider Number: [id number]  Attending Physician Name and Address:  Alberteen Sam, *  Relative Name and Phone Number:       Current Level of Care: Hospital Recommended Level of Care: Skilled Nursing Facility Prior Approval Number:    Date Approved/Denied:   PASRR Number: [id number]  Discharge Plan: SNF    Current Diagnoses: Patient Active Problem List   Diagnosis Date Noted   Hip fracture (HCC) 06/08/2020   NSVT (nonsustained ventricular tachycardia) (HCC) 09/29/2017   LUQ pain 05/27/2017   CHB (complete heart block) (HCC) 08/13/2016   Hypothyroidism 09/01/2015   Hypokalemia 05/31/2014   GERD (gastroesophageal reflux disease) 05/31/2014   Chest pain 05/31/2014    Orientation RESPIRATION BLADDER Height & Weight     Self  Normal External catheter, Incontinent Weight: 110 lb (49.9 kg) Height:  5\' 2"  (157.5 cm)  BEHAVIORAL SYMPTOMS/MOOD NEUROLOGICAL BOWEL NUTRITION STATUS      Continent Diet (see discharge summary)  AMBULATORY STATUS COMMUNICATION OF NEEDS Skin   Extensive Assist Verbally                         Personal Care Assistance Level of Assistance  Bathing, Dressing, Feeding Bathing Assistance: Limited assistance Feeding assistance: Independent Dressing Assistance: Limited assistance     Functional Limitations Info  Sight, Hearing, Speech Sight Info: Adequate Hearing Info: Adequate Speech Info: Adequate    SPECIAL CARE FACTORS FREQUENCY  PT (By licensed PT), OT (By licensed OT)     PT Frequency: 5x week OT Frequency: 5x week            Contractures Contractures Info: Not present     Additional Factors Info  Code Status, Allergies, Psychotropic Code Status Info: Full Code Allergies Info: Zyprexa (Olanzapine), Sulfa Antibiotics Psychotropic Info: escitalopram (LEXAPRO) tablet 10 mg daily PO; QUEtiapine (SEROQUEL) tablet 25 mg daily PO         Current Medications (06/10/2020):  This is the current hospital active medication list Current Facility-Administered Medications  Medication Dose Route Frequency Provider Last Rate Last Admin   acetaminophen (TYLENOL) tablet 325-650 mg  325-650 mg Oral Q6H PRN 06/12/2020, PA-C       acetaminophen (TYLENOL) tablet 500 mg  500 mg Oral Q8H Montez Morita, PA-C   500 mg at 06/10/20 0650   divalproex (DEPAKOTE) DR tablet 125 mg  125 mg Oral BID 06/12/20, PA-C   125 mg at 06/10/20 1034   docusate sodium (COLACE) capsule 100 mg  100 mg Oral BID 06/12/20, PA-C   100 mg at 06/10/20 1034   enoxaparin (LOVENOX) injection 30 mg  30 mg Subcutaneous Q24H 06/12/20, MD   30 mg at 06/10/20 0851   escitalopram (LEXAPRO) tablet 10 mg  10 mg Oral Daily 06/12/20, PA-C   10 mg at 06/10/20 1034   levothyroxine (SYNTHROID) tablet 75 mcg  75 mcg Oral QAC breakfast 06/12/20, PA-C   75 mcg at 06/10/20 06/12/20   menthol-cetylpyridinium (CEPACOL) lozenge 3 mg  1 lozenge Oral PRN 2774, PA-C       Or   phenol (CHLORASEPTIC) mouth spray  1 spray  1 spray Mouth/Throat PRN Montez Morita, PA-C       ondansetron [facility]) tablet 4 mg  4 mg Oral Q6H PRN Montez Morita, PA-C       Or   ondansetron [facility]) injection 4 mg  4 mg Intravenous Q6H PRN Montez Morita, PA-C       oxyCODONE (Oxy IR/ROXICODONE) immediate release tablet 5 mg  5 mg Oral Q4H PRN Danford, Earl Lites, MD       pantoprazole (PROTONIX) EC tablet 40 mg  40 mg Oral Daily Montez Morita, PA-C   40 mg at 06/10/20 1034   QUEtiapine (SEROQUEL) tablet 25 mg  25 mg Oral QHS Montez Morita, PA-C   25 mg at 06/09/20 2300     Discharge Medications: Please see discharge  summary for a list of discharge medications.  Relevant Imaging Results:  Relevant Lab Results:   Additional Information SS#[id number] [place], [place]

## 2020-06-10 NOTE — Progress Notes (Addendum)
Orthopaedic Trauma Service Progress Note  Patient ID: Laurie Humphrey MRN: [id number] DOB/AGE: [birth year] 86 y.o.  Subjective:  More awake this evening than earlier notes indicated  Confused but pleasant  No acute issues of note   ROS As above  Objective:   VITALS:   Vitals:   06/10/20 1213 06/10/20 1353 06/10/20 1527 06/10/20 1830  BP: (!) 91/50 (!) 116/55 (!) 102/47 (!) 126/42  Pulse: 70 88 62 69  Resp: 14  16 14   Temp: 97.8 F (36.6 C)  97.6 F (36.4 C) 98.1 F (36.7 C)  TempSrc:      SpO2: 91%  100% 100%  Weight:      Height:        Estimated body mass index is 20.12 kg/m as calculated from the following:   Height as of this encounter: 5' 2"  (1.575 m).   Weight as of this encounter: 49.9 kg.   Intake/Output      07/20 0701 - 07/21 0700   P.O. 120   I.V. (mL/kg)    IV Piggyback    Total Intake(mL/kg) 120 (2.4)   Urine (mL/kg/hr) 150 (0.2)   Stool 0   Blood    Total Output 150   Net -30       Urine Occurrence 2 x   Stool Occurrence 0 x     LABS  Results for orders placed or performed during the hospital encounter of 06/08/20 (from the past 24 hour(s))  CBC     Status: Abnormal   Collection Time: 06/09/20 11:13 PM  Result Value Ref Range   WBC 11.1 (H) 4.0 - 10.5 K/uL   RBC 3.53 (L) 3.87 - 5.11 MIL/uL   Hemoglobin 10.0 (L) 12.0 - 15.0 g/dL   HCT 32.0 (L) 36 - 46 %   MCV 90.7 80.0 - 100.0 fL   MCH 28.3 26.0 - 34.0 pg   MCHC 31.3 30.0 - 36.0 g/dL   RDW 14.2 11.5 - 15.5 %   Platelets 232 150 - 400 K/uL   nRBC 0.0 0.0 - 0.2 %  Creatinine, serum     Status: Abnormal   Collection Time: 06/09/20 11:13 PM  Result Value Ref Range   Creatinine, Ser 1.11 (H) 0.44 - 1.00 mg/dL   GFR calc non Af Amer 44 (L) >60 mL/min   GFR calc Af Amer 51 (L) >60 mL/min  CBC     Status: Abnormal   Collection Time: 06/10/20  2:07 AM  Result Value Ref Range   WBC 12.2 (H) 4.0 - 10.5 K/uL   RBC  3.49 (L) 3.87 - 5.11 MIL/uL   Hemoglobin 9.8 (L) 12.0 - 15.0 g/dL   HCT 31.2 (L) 36 - 46 %   MCV 89.4 80.0 - 100.0 fL   MCH 28.1 26.0 - 34.0 pg   MCHC 31.4 30.0 - 36.0 g/dL   RDW 14.1 11.5 - 15.5 %   Platelets 256 150 - 400 K/uL   nRBC 0.0 0.0 - 0.2 %  Basic metabolic panel     Status: Abnormal   Collection Time: 06/10/20  2:07 AM  Result Value Ref Range   Sodium 138 135 - 145 mmol/L   Potassium 3.7 3.5 - 5.1 mmol/L   Chloride 99 98 - 111 mmol/L   CO2 22 22 - 32 mmol/L  Glucose, Bld 202 (H) 70 - 99 mg/dL   BUN 23 8 - 23 mg/dL   Creatinine, Ser 1.19 (H) 0.44 - 1.00 mg/dL   Calcium 8.3 (L) 8.9 - 10.3 mg/dL   GFR calc non Af Amer 41 (L) >60 mL/min   GFR calc Af Amer 47 (L) >60 mL/min   Anion gap 17 (H) 5 - 15  VITAMIN D 25 Hydroxy (Vit-D Deficiency, Fractures)     Status: Abnormal   Collection Time: 06/10/20  2:07 AM  Result Value Ref Range   Vit D, 25-Hydroxy 20.89 (L) 30 - 100 ng/mL     PHYSICAL EXAM:   Gen: resting comfortably in bed, NAD Lungs: unlabored  Ext:       Right Lower Extremity   Dressing R hip stable  Scant strike through   Swelling minimal   Ext warm   + DP pulse  DPN, SPN, TN sensation grossly intact  EHL, FHL, lesser toe motor intact  No DCT   Compartments are soft   Assessment/Plan: 1 Day Post-Op   Active Problems:   Fracture of femoral neck, left (HCC)   Vitamin D insufficiency   Anti-infectives (From admission, onward)   Start     Dose/Rate Route Frequency Ordered Stop   06/09/20 2300  ceFAZolin (ANCEF) IVPB 2g/100 mL premix        2 g 200 mL/hr over 30 Minutes Intravenous Every 8 hours 06/09/20 2056 06/10/20 0737    .  POD/HD#: 1  86 y/o female fall with L femoral neck fracture   -low energy pathologic Left femoral neck fracture (osteoporosis) s/p L hip hemiarthroplasty   WBAT L leg with assistance  Posterior hip precautions   Ok to dc knee immobilizer   Place pillows between legs when in bed or in chair    Avoid excessive  internal rotation when in chair   PT/OT  Dressing changes as needed starting tomorrow   Ice PRN    - Pain management:  Scheduled tylenol   Minimize narcotics    - ABL anemia/Hemodynamics  Stable   Monitor   Minimal intra-op blood loss   - Medical issues   Per primary   - DVT/PE prophylaxis:  Lovenox x 30 days  - ID:   periop abx  - Metabolic Bone Disease:  Fracture suggestive of osteoporosis   Fracture Liaison service criteria met. Order placed  + vitamin d insufficiency    Supplement  RD consult to maximize nutrition   - Activity:  Up with assistance    - FEN/GI prophylaxis/Foley/Lines:  Dysphagia 1, thin liquid  - Impediments to fracture healing:  Osteoporosis   Vitamin d insufficiency   - Dispo:  Continue with therapies   SNF once bed available and medically stable    Think she should be ready by Thursday    Jari Pigg, PA-C [phone] (C) 06/10/2020, 7:46 PM  [facility] [place] [address] [phone] Domingo Sep (F)

## 2020-06-10 NOTE — Evaluation (Signed)
Occupational Therapy Evaluation Patient Details Name: Laurie Humphrey MRN: [id number] DOB: [birth year] Today's Date: 06/10/2020    History of Present Illness Pt is an 86 yo female s/p knee pain and inability to bear wt on LLE, s/p fall history requiring L hemiarthroplasty. PMHx: multiple falls, osteoporosis, dementia. Pt came from ALF.   Clinical Impression   Pt PTA: Pt living in dementia unit with assist for ADL and mobility.Pt currently with dementia at baseline and limited by hip pain, decreased strength and decreased activity tolerance. Pt modA to totalA for ADL; modA+2 for mobility due to L hip pain and difficulty sequencing with dementia. Pt would benefit from continued OT skilled services for ADL, mobility and safety. OT following acutely.      Follow Up Recommendations  SNF;Supervision/Assistance - 24 hour    Equipment Recommendations  Other (comment) (defer to next facility)    Recommendations for Other Services       Precautions / Restrictions Precautions Precautions: Fall;Posterior Hip Precaution Booklet Issued: No Precaution Comments: hx of falls, pt too lethargic to comprehend posterior hip precautions Required Braces or Orthoses: Knee Immobilizer - Left Knee Immobilizer - Left: Other (comment) Restrictions Weight Bearing Restrictions: Yes LLE Weight Bearing: Weight bearing as tolerated Other Position/Activity Restrictions: posterior hip precautions      Mobility Bed Mobility Overal bed mobility: Needs Assistance Bed Mobility: Supine to Sit;Sit to Supine     Supine to sit: Mod assist;HOB elevated Sit to supine: Max assist;+2 for physical assistance   General bed mobility comments: Cues for hand placement, scooting to EOB and for trunk and BLEs to get back in bed.  Transfers Overall transfer level: Needs assistance Equipment used: Rolling walker (2 wheeled) Transfers: Sit to/from Stand Sit to Stand: Mod assist;+2 safety/equipment         General  transfer comment: assist for power up    Balance Overall balance assessment: Needs assistance;History of Falls Sitting-balance support: Feet supported;Bilateral upper extremity supported Sitting balance-Leahy Scale: Poor Sitting balance - Comments: MinA for sitting; posterior lean present with cues to sit upright Postural control: Posterior lean Standing balance support: During functional activity Standing balance-Leahy Scale: Poor Standing balance comment: Requires UE support and Min A for balance. Posterior bias.                           ADL either performed or assessed with clinical judgement   ADL Overall ADL's : Needs assistance/impaired Eating/Feeding: Moderate assistance;Sitting;Bed level   Grooming: Moderate assistance;Sitting Grooming Details (indicate cue type and reason): Pt able to lightly wipe mouth, but unable to reach eyes/forehead of face Upper Body Bathing: Moderate assistance;Sitting;Bed level   Lower Body Bathing: Total assistance;Sitting/lateral leans;Sit to/from stand   Upper Body Dressing : Moderate assistance;Sitting   Lower Body Dressing: Maximal assistance Lower Body Dressing Details (indicate cue type and reason): Briefs applied as pt is incontinent Toilet Transfer: Stand-pivot;Cueing for safety;Cueing for sequencing;Moderate assistance;+2 for safety/equipment;BSC   Toileting- Clothing Manipulation and Hygiene: Total assistance;Sitting/lateral lean;Sit to/from stand Toileting - Clothing Manipulation Details (indicate cue type and reason): pt incontinent of bowels. pt stating "no" when asked if she wanted to get on [facility] and then pt stood and pt was actively having a BM and required a quick transfer to [facility].     Functional mobility during ADLs: Moderate assistance;+2 for physical assistance;+2 for safety/equipment;Cueing for safety;Cueing for sequencing General ADL Comments: Pt with dementia at baseline and limited by hip pain, decreased strength  and decreased activity tolerance.     Vision Baseline Vision/History: No visual deficits Patient Visual Report: No change from baseline Vision Assessment?: No apparent visual deficits     Perception     Praxis      Pertinent Vitals/Pain Pain Assessment: Faces Faces Pain Scale: Hurts a little bit Pain Location: left hip with movement Pain Descriptors / Indicators: Grimacing;Guarding;Operative site guarding;Sore Pain Intervention(s): Monitored during session     Hand Dominance Right   Extremity/Trunk Assessment Upper Extremity Assessment Upper Extremity Assessment: Generalized weakness   Lower Extremity Assessment Lower Extremity Assessment: LLE deficits/detail;Defer to PT evaluation LLE Deficits / Details: s/p hip hemi   Cervical / Trunk Assessment Cervical / Trunk Assessment: Kyphotic   Communication Communication Communication: Expressive difficulties (mumbling? due to lethargy)   Cognition Arousal/Alertness: Lethargic;Suspect due to medications Behavior During Therapy: [facility] for tasks assessed/performed Overall Cognitive Status: History of cognitive impairments - at baseline                                 General Comments: Oriented to self only today. Follows 1 step commands. Pleasant. Hx of dementia.   General Comments  vss    Exercises     Shoulder Instructions      Home Living                                          Prior Functioning/Environment Level of Independence: Needs assistance  Gait / Transfers Assistance Needed: Per notes, pt is ambulatory independently but does have lots of falls. ADL's / Homemaking Assistance Needed: Has a caregiver at memory care facility that assists with ADLs.            OT Problem List: Decreased strength;Decreased activity tolerance;Impaired balance (sitting and/or standing);Decreased safety awareness;Decreased knowledge of use of DME or AE;Decreased knowledge of  precautions;Pain;Increased edema      OT Treatment/Interventions: Self-care/ADL training;Therapeutic exercise;Energy conservation;DME and/or AE instruction;Therapeutic activities;Patient/family education;Balance training;Cognitive remediation/compensation    OT Goals(Current goals can be found in the care plan section) Acute Rehab OT Goals Patient Stated Goal: none stated, pleasant OT Goal Formulation: With patient Time For Goal Achievement: 06/24/20 Potential to Achieve Goals: Good ADL Goals Pt Will Perform Grooming: with min assist;sitting Pt Will Perform Lower Body Dressing: with min guard assist;sitting/lateral leans;sit to/from stand Pt Will Transfer to Toilet: with min guard assist;ambulating;bedside commode Additional ADL Goal #1: Pt will increase to minA overall for bed mobiltiy as precursor for performing ADLs at EOB.  OT Frequency: Min 2X/week   Barriers to D/C:            Co-evaluation PT/OT/SLP Co-Evaluation/Treatment: Yes Reason for Co-Treatment: Complexity of the patient's impairments (multi-system involvement);To address functional/ADL transfers   OT goals addressed during session: ADL's and self-care      AM-PAC OT "6 Clicks" Daily Activity     Outcome Measure Help from another person eating meals?: A Lot Help from another person taking care of personal grooming?: A Lot Help from another person toileting, which includes using toliet, bedpan, or urinal?: Total Help from another person bathing (including washing, rinsing, drying)?: Total Help from another person to put on and taking off regular upper body clothing?: A Lot Help from another person to put on and taking off regular lower body clothing?: A Lot 6 Click Score: 10  End of Session Equipment Utilized During Treatment: Gait belt Nurse Communication: Mobility status  Activity Tolerance: Patient limited by pain;Patient limited by lethargy Patient left: in bed;with call bell/phone within reach;with bed  alarm set  OT Visit Diagnosis: Unsteadiness on feet (R26.81);Muscle weakness (generalized) (M62.81);Pain;Other symptoms and signs involving cognitive function Pain - Right/Left: Left Pain - part of body: Leg                Time: 1020-1036 OT Time Calculation (min): 16 min Charges:  OT General Charges $OT Visit: 1 Visit OT Evaluation $OT Eval Moderate Complexity: 1 Mod  Flora Lipps, OTR/L Acute Rehabilitation Services Pager: [phone] Office: [phone]   Ofelia Podolski C 06/10/2020, 4:47 PM

## 2020-06-11 ENCOUNTER — Encounter (HOSPITAL_COMMUNITY): Payer: Self-pay | Admitting: Orthopedic Surgery

## 2020-06-11 DIAGNOSIS — F0391 Unspecified dementia with behavioral disturbance: Secondary | ICD-10-CM

## 2020-06-11 DIAGNOSIS — E559 Vitamin D deficiency, unspecified: Secondary | ICD-10-CM

## 2020-06-11 LAB — IRON AND TIBC
Iron: 13 ug/dL — ABNORMAL LOW (ref 28–170)
Saturation Ratios: 4 % — ABNORMAL LOW (ref 10.4–31.8)
TIBC: 340 ug/dL (ref 250–450)
UIBC: 327 ug/dL

## 2020-06-11 LAB — HEMOGLOBIN AND HEMATOCRIT, BLOOD
HCT: 26.1 % — ABNORMAL LOW (ref 36.0–46.0)
HCT: 24.7 % — ABNORMAL LOW (ref 36.0–46.0)
Hemoglobin: 8.2 g/dL — ABNORMAL LOW (ref 12.0–15.0)
Hemoglobin: 7.8 g/dL — ABNORMAL LOW (ref 12.0–15.0)

## 2020-06-11 LAB — CBC
HCT: 24.1 % — ABNORMAL LOW (ref 36.0–46.0)
Hemoglobin: 7.7 g/dL — ABNORMAL LOW (ref 12.0–15.0)
MCH: 28.7 pg (ref 26.0–34.0)
MCHC: 32 g/dL (ref 30.0–36.0)
MCV: 89.9 fL (ref 80.0–100.0)
Platelets: 202 10*3/uL (ref 150–400)
RBC: 2.68 MIL/uL — ABNORMAL LOW (ref 3.87–5.11)
RDW: 14.5 % (ref 11.5–15.5)
WBC: 6.5 10*3/uL (ref 4.0–10.5)
nRBC: 0 % (ref 0.0–0.2)

## 2020-06-11 LAB — BASIC METABOLIC PANEL
Anion gap: 8 (ref 5–15)
BUN: 33 mg/dL — ABNORMAL HIGH (ref 8–23)
CO2: 31 mmol/L (ref 22–32)
Calcium: 8 mg/dL — ABNORMAL LOW (ref 8.9–10.3)
Chloride: 100 mmol/L (ref 98–111)
Creatinine, Ser: 1.47 mg/dL — ABNORMAL HIGH (ref 0.44–1.00)
GFR calc Af Amer: 37 mL/min — ABNORMAL LOW (ref >60)
GFR calc non Af Amer: 32 mL/min — ABNORMAL LOW (ref >60)
Glucose, Bld: 111 mg/dL — ABNORMAL HIGH (ref 70–99)
Potassium: 4.4 mmol/L (ref 3.5–5.1)
Sodium: 139 mmol/L (ref 135–145)

## 2020-06-11 LAB — SARS CORONAVIRUS 2 (TAT 6-24 HRS): SARS Coronavirus 2: NEGATIVE

## 2020-06-11 LAB — RETICULOCYTES
Immature Retic Fract: 6.4 % (ref 2.3–15.9)
RBC.: 2.9 MIL/uL — ABNORMAL LOW (ref 3.87–5.11)
Retic Count, Absolute: 42.9 10*3/uL (ref 19.0–186.0)
Retic Ct Pct: 1.5 % (ref 0.4–3.1)

## 2020-06-11 LAB — VITAMIN B12: Vitamin B-12: 384 pg/mL (ref 180–914)

## 2020-06-11 LAB — FERRITIN: Ferritin: 55 ng/mL (ref 11–307)

## 2020-06-11 MED ORDER — PROSOURCE PLUS PO LIQD
30.0000 mL | Freq: Two times a day (BID) | ORAL | Status: DC
  Administered 2020-06-12 – 2020-06-13 (×3): 30 mL via ORAL
  Filled 2020-06-11 (×3): qty 30

## 2020-06-11 MED ORDER — ALPRAZOLAM 0.5 MG PO TABS
0.5000 mg | ORAL_TABLET | Freq: Two times a day (BID) | ORAL | Status: DC
  Filled 2020-06-11: qty 1

## 2020-06-11 MED ORDER — ADULT MULTIVITAMIN W/MINERALS CH
1.0000 | ORAL_TABLET | Freq: Every day | ORAL | Status: DC
  Administered 2020-06-12 – 2020-06-13 (×2): 1 via ORAL
  Filled 2020-06-11 (×2): qty 1

## 2020-06-11 MED ORDER — LORAZEPAM 2 MG/ML IJ SOLN
0.5000 mg | Freq: Three times a day (TID) | INTRAMUSCULAR | Status: DC | PRN
  Administered 2020-06-11: 1 mg via INTRAVENOUS
  Filled 2020-06-11 (×2): qty 1

## 2020-06-11 NOTE — Progress Notes (Signed)
Orthopaedic Trauma Service Progress Note  Patient ID: JEVAEH SHAMS MRN: [id number] DOB/AGE: 1929-05-17 86 y.o.  Subjective:  Ortho issues stable   ROS  Objective:   VITALS:   Vitals:   06/10/20 2305 06/11/20 0503 06/11/20 0617 06/11/20 1024  BP: (!) 109/52 (!) 116/50 (!) 123/47 119/85  Pulse: 64 (!) 59 62 63  Resp:  17 18 14   Temp:  98.5 F (36.9 C) 97.8 F (36.6 C) 98 F (36.7 C)  TempSrc:   Oral   SpO2:  100% 91% 100%  Weight:      Height:        Estimated body mass index is 20.12 kg/m as calculated from the following:   Height as of this encounter: 5' 2"  (1.575 m).   Weight as of this encounter: 49.9 kg.   Intake/Output      07/20 0701 - 07/21 0700 07/21 0701 - 07/22 0700   P.O. 120    I.V. (mL/kg)     IV Piggyback     Total Intake(mL/kg) 120 (2.4)    Urine (mL/kg/hr) 150 (0.1) 100 (0.4)   Stool 0    Blood     Total Output 150 100   Net -30 -100        Urine Occurrence 2 x    Stool Occurrence 0 x      LABS  Results for orders placed or performed during the hospital encounter of 06/08/20 (from the past 24 hour(s))  CBC     Status: Abnormal   Collection Time: 06/11/20  1:51 AM  Result Value Ref Range   WBC 6.5 4.0 - 10.5 K/uL   RBC 2.68 (L) 3.87 - 5.11 MIL/uL   Hemoglobin 7.7 (L) 12.0 - 15.0 g/dL   HCT 24.1 (L) 36 - 46 %   MCV 89.9 80.0 - 100.0 fL   MCH 28.7 26.0 - 34.0 pg   MCHC 32.0 30.0 - 36.0 g/dL   RDW 14.5 11.5 - 15.5 %   Platelets 202 150 - 400 K/uL   nRBC 0.0 0.0 - 0.2 %  Basic metabolic panel     Status: Abnormal   Collection Time: 06/11/20  1:51 AM  Result Value Ref Range   Sodium 139 135 - 145 mmol/L   Potassium 4.4 3.5 - 5.1 mmol/L   Chloride 100 98 - 111 mmol/L   CO2 31 22 - 32 mmol/L   Glucose, Bld 111 (H) 70 - 99 mg/dL   BUN 33 (H) 8 - 23 mg/dL   Creatinine, Ser 1.47 (H) 0.44 - 1.00 mg/dL   Calcium 8.0 (L) 8.9 - 10.3 mg/dL   GFR calc non Af Amer 32  (L) >60 mL/min   GFR calc Af Amer 37 (L) >60 mL/min   Anion gap 8 5 - 15     PHYSICAL EXAM:   Gen: resting comfortably in bed, NAD Lungs: unlabored  Ext:       Right Lower Extremity              Dressing R hip stable             mild strike through.  Though appears dry              Swelling minimal  Ext warm              + DP pulse             DPN, SPN, TN sensation grossly intact             EHL, FHL, lesser toe motor intact             No DCT              Compartments are soft    Assessment/Plan: 2 Days Post-Op   Active Problems:   Fracture of femoral neck, left (HCC)   Vitamin D insufficiency   Anti-infectives (From admission, onward)   Start     Dose/Rate Route Frequency Ordered Stop   06/09/20 2300  ceFAZolin (ANCEF) IVPB 2g/100 mL premix        2 g 200 mL/hr over 30 Minutes Intravenous Every 8 hours 06/09/20 2056 06/10/20 0737    .  POD/HD#: 2 86 y/o female fall with L femoral neck fracture    -low energy pathologic Left femoral neck fracture (osteoporosis) s/p L hip hemiarthroplasty              WBAT L leg with assistance             Posterior hip precautions                         Ok to dc knee immobilizer                         Place pillows between legs when in bed or in chair                                     Avoid excessive internal rotation when in chair              PT/OT             Dressing changes as needed starting today              Ice PRN      - Pain management:             Scheduled tylenol              Minimize narcotics    Pt has only had tylenol for the past 24 hours and this appears effective               - ABL anemia/Hemodynamics             Stable              Monitor             CBC in am    May need transfusion   Labs not surprising in setting of injury and surgery    - Medical issues              Per primary    - DVT/PE prophylaxis:             Lovenox x 30 days  - ID:              periop abx  completed    - Metabolic Bone Disease:             Fracture suggestive of osteoporosis  Fracture Liaison service criteria met. Order placed             + vitamin d insufficiency                          Supplement             RD consult to maximize nutrition    - Activity:             Up with assistance               - FEN/GI prophylaxis/Foley/Lines:             Dysphagia 1, thin liquid   - Impediments to fracture healing:             Osteoporosis              Vitamin d insufficiency    - Dispo:             Continue with therapies              SNF once bed available and medically stable                          I have place order for repeat covid screen      Jari Pigg, PA-C [phone] (C) 06/11/2020, 11:55 AM  [facility] [place] [address] [phone] Domingo Sep (F)

## 2020-06-11 NOTE — TOC Progression Note (Signed)
Transition of Care [facility]) - Progression Note    Patient Details  Name: Laurie Humphrey MRN: [id number] Date of Birth: [birth year]  [facility]) CM/SW Contact  Doy Hutching, [place] Phone Number: 06/11/2020, 1:40 PM  Clinical Narrative:    Spoke with [name] on phone, let her know that [facility] in [place] had offered a bed but that [facility] could not offer at this time.   Sent address and Medicare.gov CMS ratings for [place] to pt daughter via text.     Expected Discharge Plan: Skilled Nursing Facility Barriers to Discharge: Continued Medical Work up  Expected Discharge Plan and Services Expected Discharge Plan: Skilled Nursing Facility In-house Referral: Clinical Social Work Discharge Planning Services: CM Consult Post Acute Care Choice: Skilled Nursing Facility Living arrangements for the past 2 months: Assisted Living Facility  Readmission Risk Interventions Readmission Risk Prevention Plan 06/10/2020  Post Dischage Appt Not Complete  Appt Comments facility resident  Medication Screening Complete  Transportation Screening Complete  Some recent data might be hidden

## 2020-06-11 NOTE — Progress Notes (Signed)
PROGRESS NOTE    Laurie Humphrey  OHY:[id number] DOB: 28-Sep-1929 DOA: 06/08/2020 PCP: [name], MD     Brief Narrative:  Laurie Humphrey is a 86 y.o. WF PMHx Dementia, hypothyroidism, osteoporosis, Complete Heart Block s/p pacemaker placement   Presented from her assisted living facility with complaints of left knee pain today.  She is normally ambulatory at baseline but had refused to put weight on her left leg today.  Apparently there were no recent falls documented, but she is noted to fall quite frequently and has had about 4 falls recently.  She does not complain of hip or low back pain and states that her knee bothers her especially with movement.  Further history cannot be obtained from the patient as she is a poor historian on account of her dementia.   ED Course: Stable vital signs noted.  Laboratory data with CBC thus far with no acute abnormalities.  Covid testing negative.  Patient appears comfortable.  Agreeable for transfer for orthopedic evaluation.  EDP has spoken with Dr. Victorino Dike with orthopedics who will assess in a.m.   Subjective: Afebrile overnight, A/O x1 (does not know where, when, why).  Patient has disrobed, attempting to remove her soft cast, attempting to jump out of the bed over the rails.   Assessment & Plan: Covid vaccination;   Active Problems:   Fracture of femoral neck, left (HCC)   Vitamin D insufficiency   Left femoral neck fracture -Likely related to a recent fall -Transfer to [facility] for further evaluation per orthopedics -N.p.o. after midnight -Pain management as ordered  Hypothyroidism -Levothyroxine on 75 mcg daily  Dementia/Agitation -Ativan IV PRN -Depakote 125 mg BID -Lexapro 10 mg daily -Seroquel 25 mg daily  GERD -PPI  History of complete heart block with pacemaker -Stable  Anemia -GI bleed?  Patient's H/H continues to drop -H/H q 12 hr -Anemia panel pending -Occult blood pending  Moderate protein calorie  malnutrition -Encourage p.o. intake -Prosource 30 ml BIDl    DVT prophylaxis: Lovenox Code Status: Full Family Communication: 7/21 daughter [name] at bedside for discussion of plan of care Status is: Inpatient    Dispo: The patient is from: Assisted living facility              Anticipated d/c is to: SNF ?              Anticipated d/c date is: Per surgery              Patient currently unstable      Consultants:  Orthopedic surgery    Procedures/Significant Events:  2/19 DISPLACED LEFT FEMORAL NECK FRACTURE;UNIPOLAR HEMIARTHROPLASTY OF THE LEFT HIP with [place] Basic #3 femoral stem, standard neck, 44 mm head   I have personally reviewed and interpreted all radiology studies and my findings are as above.  VENTILATOR SETTINGS:    Cultures   Antimicrobials:    Devices    LINES / TUBES:      Continuous Infusions:   Objective: Vitals:   06/10/20 2305 06/11/20 0503 06/11/20 0617 06/11/20 1024  BP: (!) 109/52 (!) 116/50 (!) 123/47 119/85  Pulse: 64 (!) 59 62 63  Resp:  17 18 14   Temp:  98.5 F (36.9 C) 97.8 F (36.6 C) 98 F (36.7 C)  TempSrc:   Oral   SpO2:  100% 91% 100%  Weight:      Height:        Intake/Output Summary (Last 24 hours) at 06/11/2020  1141 Last data filed at 06/11/2020 1015 Gross per 24 hour  Intake 120 ml  Output 250 ml  Net -130 ml   Filed Weights   06/08/20 0927  Weight: 49.9 kg    Examination:  General: A/O x1, paranoid, No acute respiratory distress Eyes: negative scleral hemorrhage, negative anisocoria, negative icterus ENT: Negative Runny nose, negative gingival bleeding, Neck:  Negative scars, masses, torticollis, lymphadenopathy, JVD Lungs: Clear to auscultation bilaterally without wheezes or crackles Cardiovascular: Regular rate and rhythm without murmur gallop or rub normal S1 and S2 Abdomen: negative abdominal pain, nondistended, positive soft, bowel sounds, no rebound, no ascites, no appreciable  mass Extremities: No significant cyanosis, clubbing, or edema bilateral lower extremities Skin: Negative rashes, lesions, ulcers Psychiatric:  Negative depression, negative anxiety, negative fatigue, negative mania, positive paranoia believes her daughter is attempting to harm her Central nervous system:  Cranial nerves II through XII intact, tongue/uvula midline, all extremities muscle strength 5/5, sensation intact throughout, negative dysarthria, negative expressive aphasia, negative receptive aphasia.  .     Data Reviewed: Care during the described time interval was provided by me .  I have reviewed this patient's available data, including medical history, events of note, physical examination, and all test results as part of my evaluation.  CBC: Recent Labs  Lab 06/08/20 1540 06/09/20 2313 06/10/20 0207 06/11/20 0151  WBC 7.6 11.1* 12.2* 6.5  NEUTROABS 5.6  --   --   --   HGB 10.6* 10.0* 9.8* 7.7*  HCT 33.8* 32.0* 31.2* 24.1*  MCV 91.1 90.7 89.4 89.9  PLT 227 232 256 202   Basic Metabolic Panel: Recent Labs  Lab 06/08/20 1540 06/09/20 2313 06/10/20 0207 06/11/20 0151  NA 139  --  138 139  K 4.0  --  3.7 4.4  CL 102  --  99 100  CO2 27  --  22 31  GLUCOSE 98  --  202* 111*  BUN 22  --  23 33*  CREATININE 1.04* 1.11* 1.19* 1.47*  CALCIUM 8.2*  --  8.3* 8.0*   GFR: Estimated Creatinine Clearance: 20.8 mL/min (A) (by C-G formula based on SCr of 1.47 mg/dL (H)). Liver Function Tests: No results for input(s): AST, ALT, ALKPHOS, BILITOT, PROT, ALBUMIN in the last 168 hours. No results for input(s): LIPASE, AMYLASE in the last 168 hours. No results for input(s): AMMONIA in the last 168 hours. Coagulation Profile: No results for input(s): INR, PROTIME in the last 168 hours. Cardiac Enzymes: No results for input(s): CKTOTAL, CKMB, CKMBINDEX, TROPONINI in the last 168 hours. BNP (last 3 results) No results for input(s): PROBNP in the last 8760 hours. HbA1C: No results  for input(s): HGBA1C in the last 72 hours. CBG: No results for input(s): GLUCAP in the last 168 hours. Lipid Profile: No results for input(s): CHOL, HDL, LDLCALC, TRIG, CHOLHDL, LDLDIRECT in the last 72 hours. Thyroid Function Tests: No results for input(s): TSH, T4TOTAL, FREET4, T3FREE, THYROIDAB in the last 72 hours. Anemia Panel: No results for input(s): VITAMINB12, FOLATE, FERRITIN, TIBC, IRON, RETICCTPCT in the last 72 hours. Sepsis Labs: No results for input(s): PROCALCITON, LATICACIDVEN in the last 168 hours.  Recent Results (from the past 240 hour(s))  SARS Coronavirus 2 by RT PCR (hospital order, performed in [facility] hospital lab) Nasopharyngeal Nasopharyngeal Swab     Status: None   Collection Time: 06/08/20  2:22 PM   Specimen: Nasopharyngeal Swab  Result Value Ref Range Status   SARS Coronavirus 2 NEGATIVE NEGATIVE Final  Comment: (NOTE) SARS-CoV-2 target nucleic acids are NOT DETECTED.  The SARS-CoV-2 RNA is generally detectable in upper and lower respiratory specimens during the acute phase of infection. The lowest concentration of SARS-CoV-2 viral copies this assay can detect is 250 copies / mL. A negative result does not preclude SARS-CoV-2 infection and should not be used as the sole basis for treatment or other patient management decisions.  A negative result may occur with improper specimen collection / handling, submission of specimen other than nasopharyngeal swab, presence of viral mutation(s) within the areas targeted by this assay, and inadequate number of viral copies (<250 copies / mL). A negative result must be combined with clinical observations, patient history, and epidemiological information.  Fact Sheet for Patients:   BoilerBrush.com.cy  Fact Sheet for Healthcare Providers: https://pope.com/  This test is not yet approved or  cleared by the [place] FDA and has been authorized for  detection and/or diagnosis of SARS-CoV-2 by FDA under an Emergency Use Authorization (EUA).  This EUA will remain in effect (meaning this test can be used) for the duration of the COVID-19 declaration under Section 564(b)(1) of the Act, 21 U.S.C. section 360bbb-3(b)(1), unless the authorization is terminated or revoked sooner.  Performed at [facility], [address] [place], [place] [address]   MRSA PCR Screening     Status: None   Collection Time: 06/09/20 12:28 PM   Specimen: Nasal Mucosa; Nasopharyngeal  Result Value Ref Range Status   MRSA by PCR NEGATIVE NEGATIVE Final    Comment:        The GeneXpert MRSA Assay (FDA approved for NASAL specimens only), is one component of a comprehensive MRSA colonization surveillance program. It is not intended to diagnose MRSA infection nor to guide or monitor treatment for MRSA infections. Performed at [facility], [address] [place], [place] [address]          Radiology Studies: DG HIP PORT UNILAT WITH PELVIS 1V LEFT  Result Date: 06/09/2020 CLINICAL DATA:  Status post left hip hemiarthroplasty EXAM: DG HIP (WITH OR WITHOUT PELVIS) 1V PORT LEFT COMPARISON:  None. FINDINGS: Status post left hip hemiarthroplasty. There is postoperative gas surrounding the proximal femur. Prosthetic components are in expected position. IMPRESSION: Expected postoperative appearance of left hip hemiarthroplasty. Electronically Signed   By: Deatra Robinson M.D.   On: 06/09/2020 20:23        Scheduled Meds: . acetaminophen  500 mg Oral Q8H  . divalproex  125 mg Oral BID  . docusate sodium  100 mg Oral BID  . enoxaparin (LOVENOX) injection  30 mg Subcutaneous Q24H  . escitalopram  10 mg Oral Daily  . levothyroxine  75 mcg Oral QAC breakfast  . pantoprazole  40 mg Oral Daily  . QUEtiapine  25 mg Oral QHS   Continuous Infusions:   LOS: 3 days    Time spent:40 min    Prim Morace, Roselind Messier, MD [facility] Pager  [phone]  If 7PM-7AM, please contact night-coverage [email or web address] Password [facility] 06/11/2020, 11:41 AM

## 2020-06-11 NOTE — Anesthesia Postprocedure Evaluation (Signed)
Anesthesia Post Note  Patient: Laurie Humphrey  Procedure(s) Performed: ARTHROPLASTY BIPOLAR HIP (HEMIARTHROPLASTY) (Left Hip)     Patient location during evaluation: PACU Anesthesia Type: General Level of consciousness: awake and alert Pain management: pain level controlled Vital Signs Assessment: post-procedure vital signs reviewed and stable Respiratory status: spontaneous breathing, nonlabored ventilation, respiratory function stable and patient connected to nasal cannula oxygen Cardiovascular status: blood pressure returned to baseline and stable Postop Assessment: no apparent nausea or vomiting Anesthetic complications: no   No complications documented.  Last Vitals:  Vitals:   06/11/20 0617 06/11/20 1024  BP: (!) 123/47 119/85  Pulse: 62 63  Resp: 18 14  Temp: 36.6 C 36.7 C  SpO2: 91% 100%    Last Pain:  Vitals:   06/11/20 0617  TempSrc: Oral  PainSc:    Pain Goal:                   [name] S

## 2020-06-11 NOTE — TOC Progression Note (Signed)
Transition of Care [facility]) - Progression Note    Patient Details  Name: Laurie Humphrey MRN: [id number] Date of Birth: May 07, 1929  Transition [facility]) CM/SW Contact  Doy Hutching, [place] Phone Number: 06/11/2020, 10:38 AM  Clinical Narrative:    CSW f/u with [facility] in admissions at preferred SNF; [facility]. She will review pt information and reach back out to this Clinical research associate. Pt likely will need new COVID swab. Await response from SNF.    Expected Discharge Plan: Skilled Nursing Facility Barriers to Discharge: Continued Medical Work up  Expected Discharge Plan and Services Expected Discharge Plan: Skilled Nursing Facility In-house Referral: Clinical Social Work Discharge Planning Services: CM Consult Post Acute Care Choice: Skilled Nursing Facility Living arrangements for the past 2 months: Assisted Living Facility                  Readmission Risk Interventions Readmission Risk Prevention Plan 06/10/2020  Post Dischage Appt Not Complete  Appt Comments facility resident  Medication Screening Complete  Transportation Screening Complete  Some recent data might be hidden

## 2020-06-11 NOTE — Progress Notes (Signed)
Physical Therapy Treatment Patient Details Name: Laurie Humphrey MRN: [id number] DOB: [birth year] Today's Date: 06/11/2020    History of Present Illness Pt is an 86 yo female s/p knee pain and inability to bear wt on LLE, s/p fall history requiring L hemiarthroplasty. PMHx: multiple falls, osteoporosis, dementia. Pt came from ALF.    PT Comments    Pt sleeping upon arrival, easily awoke and engaged in conversation. Pt repeating "I just feel well. My stomach." RN notified. Pt re-oriented to situation but no carry over. Pt very reluctant to move due to onset of pain in L hip. Pt completed std pvt to chair with maxAx2 and set up with warm blankets as pt was cold. Pt appreciative and fell asleep. Pt remains appropriate for SNF upon d/c.     Follow Up Recommendations  SNF;Supervision for mobility/OOB;Supervision/Assistance - 24 hour     Equipment Recommendations  Rolling walker with 5" wheels    Recommendations for Other Services       Precautions / Restrictions Precautions Precautions: Fall;Posterior Hip Precaution Booklet Issued: No Precaution Comments: hx of falls, pt too lethargic to comprehend posterior hip precautions Required Braces or Orthoses: Knee Immobilizer - Left (now D/C'd by Montez Morita, PA) Knee Immobilizer - Left: Other (comment) (place pillow between legs in bed and chair) Restrictions Weight Bearing Restrictions: Yes LLE Weight Bearing: Weight bearing as tolerated Other Position/Activity Restrictions: posterior hip precautions    Mobility  Bed Mobility Overal bed mobility: Needs Assistance Bed Mobility: Supine to Sit     Supine to sit: HOB elevated;Max assist     General bed mobility comments: maxA for LE mangement and trunk elevation  Transfers Overall transfer level: Needs assistance Equipment used: Rolling walker (2 wheeled) Transfers: Sit to/from [employer] Sit to Stand: Mod assist;+2 safety/equipment Stand pivot transfers: Max  assist;+2 physical assistance       General transfer comment: assist for power up, completed std pvt transfers with 2 person assist without RW, pt with report of pain and was dependent for weight shifting to advance LEs, pt with significant trunk flexion  Ambulation/Gait                 Stairs             Wheelchair Mobility    Modified Rankin (Stroke Patients Only)       Balance Overall balance assessment: Needs assistance;History of Falls Sitting-balance support: Feet supported;Bilateral upper extremity supported Sitting balance-Leahy Scale: Poor Sitting balance - Comments: MinA for sitting; posterior lean present with cues to sit upright Postural control: Posterior lean Standing balance support: During functional activity Standing balance-Leahy Scale: Poor Standing balance comment: Requires UE support and Min A for balance. Posterior bias.                            Cognition Arousal/Alertness: Lethargic;Awake/alert (pt keeping eyes closed but able to carry on conversation) Behavior During Therapy: [facility] for tasks assessed/performed Overall Cognitive Status: History of cognitive impairments - at baseline                                 General Comments: oriented to self, pt unaware she fell and fractured L hip, pt able to keep eyes open and talk with therapist, once settled in chair pt immediately went back to sleep      Exercises  General Comments General comments (skin integrity, edema, etc.): VSS, pt on 3Lo2 via [place], pt with urinary incontinence in stand however pt did say "I'm peeing all over the place"      Pertinent Vitals/Pain Pain Assessment: Faces Faces Pain Scale: Hurts even more Pain Location: left hip with movement Pain Descriptors / Indicators: Grimacing;Guarding;Operative site guarding;Sore Pain Intervention(s): Monitored during session    Home Living                      Prior Function             PT Goals (current goals can now be found in the care plan section) Acute Rehab PT Goals Patient Stated Goal: none stated, pleasant PT Goal Formulation: Patient unable to participate in goal setting Time For Goal Achievement: 06/24/20 Potential to Achieve Goals: Good Progress towards PT goals: Not progressing toward goals - comment    Frequency    Min 3X/week      PT Plan Current plan remains appropriate    Co-evaluation              AM-PAC PT "6 Clicks" Mobility   Outcome Measure  Help needed turning from your back to your side while in a flat bed without using bedrails?: A Lot Help needed moving from lying on your back to sitting on the side of a flat bed without using bedrails?: A Lot Help needed moving to and from a bed to a chair (including a wheelchair)?: A Lot Help needed standing up from a chair using your arms (e.g., wheelchair or bedside chair)?: A Lot Help needed to walk in hospital room?: A Lot Help needed climbing 3-5 steps with a railing? : Total 6 Click Score: 11    End of Session Equipment Utilized During Treatment: Gait belt Activity Tolerance: Patient limited by pain Patient left: in chair;with chair alarm set;with call bell/phone within reach;with nursing/sitter in room Nurse Communication: Mobility status PT Visit Diagnosis: Unsteadiness on feet (R26.81);Difficulty in walking, not elsewhere classified (R26.2)     Time: 8502-7741 PT Time Calculation (min) (ACUTE ONLY): 27 min  Charges:  $Gait Training: 8-22 mins $Therapeutic Activity: 8-22 mins                     Lewis Shock, PT, DPT Acute Rehabilitation Services Pager #: [phone] Office #: [phone]    Iona Hansen 06/11/2020, 12:24 PM

## 2020-06-11 NOTE — Progress Notes (Signed)
Initial Nutrition Assessment  DOCUMENTATION CODES:   Non-severe (moderate) malnutrition in context of chronic illness  INTERVENTION:   -MVI with minerals daily -30 ml Prostat BID, each supplement provides 100 kcals and 15 grams -Magic cup TID with meals, each supplement provides 290 kcal and 9 grams of protein  NUTRITION DIAGNOSIS:   Moderate Malnutrition related to chronic illness (dementia) as evidenced by mild fat depletion, moderate fat depletion, mild muscle depletion, moderate muscle depletion.  GOAL:   Patient will meet greater than or equal to 90% of their needs  MONITOR:   PO intake, Supplement acceptance, Diet advancement, Labs, Weight trends, Skin, I & O's  REASON FOR ASSESSMENT:   Consult Hip fracture protocol  ASSESSMENT:   Laurie Humphrey is a 86 y.o. female with medical history significant for dementia, hypothyroidism, osteoporosis, and complete heart block status post pacemaker placement who presented from her assisted living facility with complaints of left knee pain today.  Pt admitted with lt femoral neck fracture.   7/19- s/p Procedure(s): UNIPOLAR HEMIARTHROPLASTY OF THE LEFT HIP with DePuy [place] Basic #3 femoral stem, standard neck, 44 mm head 7/20- s/p BSE- advanced to dysphagia 1 diet with thin liquids  Reviewed I/O's: -30 ml x 24 hours and +425 ml since admission  UOP: 150 ml x 24 hours  Pt sitting in recliner chair, did not respond to voice or touch. Lunch tray on tray table untouched.   Per SLP note, plan to upgrade diet once pt is more alert.  Noted meal completion 0%.   Per wt hx, wt has been stable.   Medications reviewed and include colace.  Labs reviewed.   NUTRITION - FOCUSED PHYSICAL EXAM:    Most Recent Value  Orbital Region Mild depletion  Upper Arm Region Moderate depletion  Thoracic and Lumbar Region Mild depletion  Buccal Region Mild depletion  Temple Region Mild depletion  Clavicle Bone Region Moderate depletion   Clavicle and Acromion Bone Region Moderate depletion  Scapular Bone Region Moderate depletion  Dorsal Hand Moderate depletion  Patellar Region Moderate depletion  Anterior Thigh Region Moderate depletion  Posterior Calf Region Moderate depletion  Edema (RD Assessment) None  Hair Reviewed  Eyes Reviewed  Mouth Reviewed  Skin Reviewed  Nails Reviewed       Diet Order:   Diet Order            DIET - DYS 1 Room service appropriate? Yes; Fluid consistency: Thin  Diet effective now                 EDUCATION NEEDS:   No education needs have been identified at this time  Skin:  Skin Assessment: Skin Integrity Issues: Skin Integrity Issues:: Incisions Incisions: closed lt hip  Last BM:  Unknown  Height:   Ht Readings from Last 1 Encounters:  06/08/20 5\' 2"  (1.575 m)    Weight:   Wt Readings from Last 1 Encounters:  06/08/20 49.9 kg    Ideal Body Weight:  50 kg  BMI:  Body mass index is 20.12 kg/m.  Estimated Nutritional Needs:   Kcal:  1500-1700  Protein:  70-85 grams  Fluid:  > 1.5 L    06/10/20, RD, LDN, CDCES Registered Dietitian II Certified Diabetes Care and Education Specialist Please refer to [facility] for RD and/or RD on-call/weekend/after hours pager

## 2020-06-12 DIAGNOSIS — E440 Moderate protein-calorie malnutrition: Secondary | ICD-10-CM | POA: Insufficient documentation

## 2020-06-12 LAB — COMPREHENSIVE METABOLIC PANEL
ALT: 9 U/L (ref 0–44)
AST: 34 U/L (ref 15–41)
Albumin: 2.9 g/dL — ABNORMAL LOW (ref 3.5–5.0)
Alkaline Phosphatase: 35 U/L — ABNORMAL LOW (ref 38–126)
Anion gap: 8 (ref 5–15)
BUN: 24 mg/dL — ABNORMAL HIGH (ref 8–23)
CO2: 30 mmol/L (ref 22–32)
Calcium: 8 mg/dL — ABNORMAL LOW (ref 8.9–10.3)
Chloride: 102 mmol/L (ref 98–111)
Creatinine, Ser: 1.08 mg/dL — ABNORMAL HIGH (ref 0.44–1.00)
GFR calc Af Amer: 53 mL/min — ABNORMAL LOW (ref >60)
GFR calc non Af Amer: 46 mL/min — ABNORMAL LOW (ref >60)
Glucose, Bld: 94 mg/dL (ref 70–99)
Potassium: 3.9 mmol/L (ref 3.5–5.1)
Sodium: 140 mmol/L (ref 135–145)
Total Bilirubin: 0.8 mg/dL (ref 0.3–1.2)
Total Protein: 5.6 g/dL — ABNORMAL LOW (ref 6.5–8.1)

## 2020-06-12 LAB — CBC WITH DIFFERENTIAL/PLATELET
Abs Immature Granulocytes: 0.02 10*3/uL (ref 0.00–0.07)
Basophils Absolute: 0.1 10*3/uL (ref 0.0–0.1)
Basophils Relative: 1 %
Eosinophils Absolute: 0.3 10*3/uL (ref 0.0–0.5)
Eosinophils Relative: 5 %
HCT: 23.5 % — ABNORMAL LOW (ref 36.0–46.0)
Hemoglobin: 7.5 g/dL — ABNORMAL LOW (ref 12.0–15.0)
Immature Granulocytes: 0 %
Lymphocytes Relative: 23 %
Lymphs Abs: 1.3 10*3/uL (ref 0.7–4.0)
MCH: 28.8 pg (ref 26.0–34.0)
MCHC: 31.9 g/dL (ref 30.0–36.0)
MCV: 90.4 fL (ref 80.0–100.0)
Monocytes Absolute: 0.7 10*3/uL (ref 0.1–1.0)
Monocytes Relative: 12 %
Neutro Abs: 3.6 10*3/uL (ref 1.7–7.7)
Neutrophils Relative %: 59 %
Platelets: 213 10*3/uL (ref 150–400)
RBC: 2.6 MIL/uL — ABNORMAL LOW (ref 3.87–5.11)
RDW: 14.6 % (ref 11.5–15.5)
WBC: 5.9 10*3/uL (ref 4.0–10.5)
nRBC: 0 % (ref 0.0–0.2)

## 2020-06-12 LAB — PHOSPHORUS: Phosphorus: 2.8 mg/dL (ref 2.5–4.6)

## 2020-06-12 LAB — OCCULT BLOOD X 1 CARD TO LAB, STOOL: Fecal Occult Bld: NEGATIVE

## 2020-06-12 LAB — HEMOGLOBIN AND HEMATOCRIT, BLOOD
HCT: 25.5 % — ABNORMAL LOW (ref 36.0–46.0)
Hemoglobin: 8.1 g/dL — ABNORMAL LOW (ref 12.0–15.0)

## 2020-06-12 LAB — MAGNESIUM: Magnesium: 2 mg/dL (ref 1.7–2.4)

## 2020-06-12 MED ORDER — ADULT MULTIVITAMIN W/MINERALS CH: 1.0000 | ORAL_TABLET | Freq: Every day | ORAL | 1 refills | Status: AC

## 2020-06-12 MED ORDER — PROSOURCE PLUS PO LIQD: 30.0000 mL | Freq: Two times a day (BID) | ORAL | 0 refills | Status: AC

## 2020-06-12 MED ORDER — BISACODYL 5 MG PO TBEC
5.0000 mg | DELAYED_RELEASE_TABLET | Freq: Once | ORAL | Status: AC
  Administered 2020-06-12: 5 mg via ORAL
  Filled 2020-06-12: qty 1

## 2020-06-12 MED ORDER — SORBITOL 70 % SOLN
960.0000 mL | TOPICAL_OIL | Freq: Once | ORAL | Status: AC
  Administered 2020-06-12: 960 mL via RECTAL
  Filled 2020-06-12: qty 473

## 2020-06-12 MED ORDER — ENOXAPARIN SODIUM 30 MG/0.3ML ~~LOC~~ SOLN: 30.0000 mg | SUBCUTANEOUS | 0 refills | Status: AC

## 2020-06-12 MED ORDER — ACETAMINOPHEN 500 MG PO TABS: 1000.0000 mg | ORAL_TABLET | Freq: Three times a day (TID) | ORAL | 0 refills | Status: AC

## 2020-06-12 MED ORDER — VITAMIN D 25 MCG (1000 UNIT) PO TABS
2000.0000 [IU] | ORAL_TABLET | Freq: Two times a day (BID) | ORAL | Status: DC
  Administered 2020-06-12 – 2020-06-13 (×3): 2000 [IU] via ORAL
  Filled 2020-06-12 (×3): qty 2

## 2020-06-12 MED ORDER — LORAZEPAM 2 MG/ML IJ SOLN: 0.5000 mg | Freq: Three times a day (TID) | INTRAMUSCULAR | Status: DC | PRN

## 2020-06-12 MED ORDER — VITAMIN D3 25 MCG PO TABS: 2000.0000 [IU] | ORAL_TABLET | Freq: Two times a day (BID) | ORAL | 2 refills | Status: AC

## 2020-06-12 NOTE — Progress Notes (Signed)
PROGRESS NOTE    Laurie Humphrey  ZOX:[id number] DOB: [birth year] DOA: 06/08/2020 PCP: Veverly FellsSanders, Kirk S, MD     Brief Narrative:  Laurie Humphrey is a 86 y.o. WF PMHx Dementia, hypothyroidism, osteoporosis, Complete Heart Block s/p pacemaker placement   Presented from her assisted living facility with complaints of left knee pain today.  She is normally ambulatory at baseline but had refused to put weight on her left leg today.  Apparently there were no recent falls documented, but she is noted to fall quite frequently and has had about 4 falls recently.  She does not complain of hip or low back pain and states that her knee bothers her especially with movement.  Further history cannot be obtained from the patient as she is a poor historian on account of her dementia.   ED Course: Stable vital signs noted.  Laboratory data with CBC thus far with no acute abnormalities.  Covid testing negative.  Patient appears comfortable.  Agreeable for transfer for orthopedic evaluation.  EDP has spoken with Dr. Victorino DikeHewitt with orthopedics who will assess in a.m.   Subjective: 7/22 afebrile overnight, A/O x1 (does not know where, when, why).  Patient again has partially disrobed.  However much more calm.  Not trying to climb out of bed today.  Assessment & Plan: Covid vaccination;   Active Problems:   Fracture of femoral neck, left (HCC)   Vitamin D insufficiency   Malnutrition of moderate degree   Left femoral neck fracture -Likely related to a recent fall -[facility] per orthopedics -N.p.o. after midnight -Pain management as ordered  Hypothyroidism -Levothyroxine on 75 mcg daily  Dementia/Agitation -7/22 decrease Ativan IV 0.5 MG  TID PRN -Depakote 125 mg BID -Lexapro 10 mg daily -Seroquel 25 mg daily  GERD -PPI  History of complete heart block with pacemaker -Stable  Anemia -GI bleed?  Patient's H/H continues to drop -H/H q 12 hr -Anemia panel  pending -Occult blood pending  Moderate protein calorie malnutrition -Encourage p.o. intake -Prosource 30 ml BIDl  Constipation -7/22 smog enema -7/22 bisacodyl 5 mg x 1 -Patient has not had a BM which prevents her from discharging to SNF, as well as prevents us from determining if true GI bleed.  Hopefully with above treatment will resolve both issues.    DVT prophylaxis: Lovenox Code Status: Full Family Communication: 7/21 daughter Cordelia PenSherry at bedside for discussion of plan of care Status is: Inpatient    Dispo: The patient is from: Assisted living facility              Anticipated d/c is to: SNF ?              Anticipated d/c date is: 7/23              Patient currently unstable      Consultants:  Orthopedic surgery    Procedures/Significant Events:  2/19 DISPLACED LEFT FEMORAL NECK FRACTURE;UNIPOLAR HEMIARTHROPLASTY OF THE LEFT HIP with DePuy [place] Basic #3 femoral stem, standard neck, 44 mm head   I have personally reviewed and interpreted all radiology studies and my findings are as above.  VENTILATOR SETTINGS:    Cultures   Antimicrobials:    Devices    LINES / TUBES:      Continuous Infusions:   Objective: Vitals:   06/11/20 2004 06/11/20 2130 06/12/20 0533 06/12/20 1512  BP: 131/64  128/68 (!) 132/66  Pulse: 70  73 69  Resp: 16  17 16  Temp: 98 F (36.7 C)  98.2 F (36.8 C) 98.3 F (36.8 C)  TempSrc:    Oral  SpO2: (!) 84% 100% 100% 100%  Weight:      Height:        Intake/Output Summary (Last 24 hours) at 06/12/2020 1547 Last data filed at 06/12/2020 1514 Gross per 24 hour  Intake 270 ml  Output 350 ml  Net -80 ml   Filed Weights   06/08/20 0927  Weight: 49.9 kg   Physical Exam:  General: A/O x1 (does not know where, when, why), No acute respiratory distress Eyes: negative scleral hemorrhage, negative anisocoria, negative icterus ENT: Negative Runny nose, negative gingival bleeding, Neck:  Negative scars, masses,  torticollis, lymphadenopathy, JVD Lungs: Clear to auscultation bilaterally without wheezes or crackles Cardiovascular: Regular rate and rhythm without murmur gallop or rub normal S1 and S2 Abdomen: negative abdominal pain, nondistended, positive soft, bowel sounds, no rebound, no ascites, no appreciable mass Extremities: No significant cyanosis.  LEFT lower leg encased in soft splint.,  Skin: Negative rashes, lesions, ulcers Psychiatric:  Negative depression, negative anxiety, negative fatigue, negative mania  Central nervous system:  Cranial nerves II through XII intact, tongue/uvula midline, all extremities muscle strength 5/5, sensation intact throughout,  negative dysarthria, negative expressive aphasia, negative receptive aphasia.   .     Data Reviewed: Care during the described time interval was provided by me .  I have reviewed this patient's available data, including medical history, events of note, physical examination, and all test results as part of my evaluation.  CBC: Recent Labs  Lab 06/08/20 1540 06/08/20 1540 06/09/20 2313 06/09/20 2313 06/10/20 0207 06/11/20 0151 06/11/20 1158 06/11/20 2305 06/12/20 0232  WBC 7.6  --  11.1*  --  12.2* 6.5  --   --  5.9  NEUTROABS 5.6  --   --   --   --   --   --   --  3.6  HGB 10.6*   < > 10.0*   < > 9.8* 7.7* 8.2* 7.8* 7.5*  HCT 33.8*   < > 32.0*   < > 31.2* 24.1* 26.1* 24.7* 23.5*  MCV 91.1  --  90.7  --  89.4 89.9  --   --  90.4  PLT 227  --  232  --  256 202  --   --  213   < > = values in this interval not displayed.   Basic Metabolic Panel: Recent Labs  Lab 06/08/20 1540 06/09/20 2313 06/10/20 0207 06/11/20 0151 06/12/20 0232  NA 139  --  138 139 140  K 4.0  --  3.7 4.4 3.9  CL 102  --  99 100 102  CO2 27  --  22 31 30   GLUCOSE 98  --  202* 111* 94  BUN 22  --  23 33* 24*  CREATININE 1.04* 1.11* 1.19* 1.47* 1.08*  CALCIUM 8.2*  --  8.3* 8.0* 8.0*  MG  --   --   --   --  2.0  PHOS  --   --   --   --  2.8    GFR: Estimated Creatinine Clearance: 28.4 mL/min (A) (by C-G formula based on SCr of 1.08 mg/dL (H)). Liver Function Tests: Recent Labs  Lab 06/12/20 0232  AST 34  ALT 9  ALKPHOS 35*  BILITOT 0.8  PROT 5.6*  ALBUMIN 2.9*   No results for input(s): LIPASE, AMYLASE in the last 168  hours. No results for input(s): AMMONIA in the last 168 hours. Coagulation Profile: No results for input(s): INR, PROTIME in the last 168 hours. Cardiac Enzymes: No results for input(s): CKTOTAL, CKMB, CKMBINDEX, TROPONINI in the last 168 hours. BNP (last 3 results) No results for input(s): PROBNP in the last 8760 hours. HbA1C: No results for input(s): HGBA1C in the last 72 hours. CBG: No results for input(s): GLUCAP in the last 168 hours. Lipid Profile: No results for input(s): CHOL, HDL, LDLCALC, TRIG, CHOLHDL, LDLDIRECT in the last 72 hours. Thyroid Function Tests: No results for input(s): TSH, T4TOTAL, FREET4, T3FREE, THYROIDAB in the last 72 hours. Anemia Panel: Recent Labs    06/11/20 1158  VITAMINB12 384  FERRITIN 55  TIBC 340  IRON 13*  RETICCTPCT 1.5   Sepsis Labs: No results for input(s): PROCALCITON, LATICACIDVEN in the last 168 hours.  Recent Results (from the past 240 hour(s))  SARS Coronavirus 2 by RT PCR (hospital order, performed in [facility] hospital lab) Nasopharyngeal Nasopharyngeal Swab     Status: None   Collection Time: 06/08/20  2:22 PM   Specimen: Nasopharyngeal Swab  Result Value Ref Range Status   SARS Coronavirus 2 NEGATIVE NEGATIVE Final    Comment: (NOTE) SARS-CoV-2 target nucleic acids are NOT DETECTED.  The SARS-CoV-2 RNA is generally detectable in upper and lower respiratory specimens during the acute phase of infection. The lowest concentration of SARS-CoV-2 viral copies this assay can detect is 250 copies / mL. A negative result does not preclude SARS-CoV-2 infection and should not be used as the sole basis for treatment or other patient  management decisions.  A negative result may occur with improper specimen collection / handling, submission of specimen other than nasopharyngeal swab, presence of viral mutation(s) within the areas targeted by this assay, and inadequate number of viral copies (<250 copies / mL). A negative result must be combined with clinical observations, patient history, and epidemiological information.  Fact Sheet for Patients:   BoilerBrush.com.cy  Fact Sheet for Healthcare Providers: https://pope.com/  This test is not yet approved or  cleared by the [place] FDA and has been authorized for detection and/or diagnosis of SARS-CoV-2 by FDA under an Emergency Use Authorization (EUA).  This EUA will remain in effect (meaning this test can be used) for the duration of the COVID-19 declaration under Section 564(b)(1) of the Act, 21 U.S.C. section 360bbb-3(b)(1), unless the authorization is terminated or revoked sooner.  Performed at [facility], [address], [place], [place] [address]   MRSA PCR Screening     Status: None   Collection Time: 06/09/20 12:28 PM   Specimen: Nasal Mucosa; Nasopharyngeal  Result Value Ref Range Status   MRSA by PCR NEGATIVE NEGATIVE Final    Comment:        The GeneXpert MRSA Assay (FDA approved for NASAL specimens only), is one component of a comprehensive MRSA colonization surveillance program. It is not intended to diagnose MRSA infection nor to guide or monitor treatment for MRSA infections. Performed at [facility], [address], [place], [place] [address]   SARS CORONAVIRUS 2 (TAT 6-24 HRS) Nasopharyngeal Nasopharyngeal Swab     Status: None   Collection Time: 06/11/20 11:55 AM   Specimen: Nasopharyngeal Swab  Result Value Ref Range Status   SARS Coronavirus 2 NEGATIVE NEGATIVE Final    Comment: (NOTE) SARS-CoV-2 target nucleic acids are NOT DETECTED.  The SARS-CoV-2 RNA is generally  detectable in upper and lower respiratory specimens during  the acute phase of infection. Negative results do not preclude SARS-CoV-2 infection, do not rule out co-infections with other pathogens, and should not be used as the sole basis for treatment or other patient management decisions. Negative results must be combined with clinical observations, patient history, and epidemiological information. The expected result is Negative.  Fact Sheet for Patients: HairSlick.no  Fact Sheet for Healthcare Providers: quierodirigir.com  This test is not yet approved or cleared by the [place] FDA and  has been authorized for detection and/or diagnosis of SARS-CoV-2 by FDA under an Emergency Use Authorization (EUA). This EUA will remain  in effect (meaning this test can be used) for the duration of the COVID-19 declaration under Se ction 564(b)(1) of the Act, 21 U.S.C. section 360bbb-3(b)(1), unless the authorization is terminated or revoked sooner.  Performed at [facility], [address], [place], [place] [address]          Radiology Studies: No results found.      Scheduled Meds: . (feeding supplement) PROSource Plus  30 mL Oral BID BM  . acetaminophen  500 mg Oral Q8H  . cholecalciferol  2,000 Units Oral BID  . divalproex  125 mg Oral BID  . docusate sodium  100 mg Oral BID  . enoxaparin (LOVENOX) injection  30 mg Subcutaneous Q24H  . escitalopram  10 mg Oral Daily  . levothyroxine  75 mcg Oral QAC breakfast  . multivitamin with minerals  1 tablet Oral Daily  . pantoprazole  40 mg Oral Daily  . QUEtiapine  25 mg Oral QHS   Continuous Infusions:   LOS: 4 days    Time spent:40 min    Ludia Gartland, Roselind Messier, MD [facility] Pager [phone]  If 7PM-7AM, please contact night-coverage [email or web address] Password [facility] 06/12/2020, 3:47 PM

## 2020-06-12 NOTE — TOC Progression Note (Addendum)
Transition of Care [facility]) - Progression Note    Patient Details  Name: BETTEY MURAOKA MRN: [id number] Date of Birth: [birth year]  Transition of Care [facility] CM/SW Contact  Doy Hut[place], [place] Phone Number: 06/12/2020, 10:42 AM  Clinical Narrative:    11:53am- CSW spoke with pt daughter [name] via telephone, she would like to accept bed at [facility]. Aware we are waiting for MD clearance for discharge, per ortho PA Mellody Dance pt is cleared from their service. Will need d/c summary, orders and any scripts for controlled substances printed and signed.   10:58am- CSW spoke with [place], private room still available for pt at [facility], family can visit pt in room between 10-5. Will f/u with pt daughter regarding preference.   10:42am- CSW has left a message for [facility] at [facility] admissions. New COVID negative. Will f/u with pt daughter to gauge interest or if need to f/u further fax out.   Expected Discharge Plan: Skilled Nursing Facility Barriers to Discharge: Continued Medical Work up  Expected Discharge Plan and Services Expected Discharge Plan: Skilled Nursing Facility In-house Referral: Clinical Social Work Discharge Planning Services: CM Consult Post Acute Care Choice: Skilled Nursing Facility Living arrangements for the past 2 months: Assisted Living Facility    Readmission Risk Interventions Readmission Risk Prevention Plan 06/10/2020  Post Dischage Appt Not Complete  Appt Comments facility resident  Medication Screening Complete  Transportation Screening Complete  Some recent data might be hidden

## 2020-06-12 NOTE — Progress Notes (Signed)
  Speech Language Pathology Treatment: Dysphagia  Patient Details Name: DEVORA TORTORELLA MRN: [id number] DOB: [birth year] Today's Date: 06/12/2020 Time: 4098-1191 SLP Time Calculation (min) (ACUTE ONLY): 11 min  Assessment / Plan / Recommendation Clinical Impression  Mrs. Eveland demonstrates ongoing lethargy impacting PO intake. She was assessed for diet advancement, but was unable to trial regular solids. She kept eyes closed throughout session despite cues. She was seen with magic cup, fed by SLP and thin liquid tea via straw. Pt had no s/s aspiration or dysphagia with either consistency other than slow oral phase. She was attempted with regular solids, however, would not open mouth for graham cracker. Based on current mentation, suspect pt would have oral holding with solids anyway. As she is presently tolerating her diet of dys 1 (puree) and thin liquids well, continue recommendation for this diet. She is expected to d/c to SNF later today. Recommend further evaluation and management of dysphagia at SNF level of care.    HPI HPI: Mrs. Dillie is a 86 y.o. F with dementia lives in memory care, CHB s/p pacemaker, hypothyroidism and osteoporosis who presented with LEFT hip pain after a fall. Easily sedated with pain medication, impacting PO intake.      SLP Plan  Discharge SLP treatment due to (comment) (tolerating current diet; unlikely to upgrade; d/c to SNF)       Recommendations  Medication Administration: Crushed with puree Compensations: Small sips/bites      Oral Care Recommendations: Oral care BID Follow up Recommendations: None SLP Visit Diagnosis: Dysphagia, oral phase (R13.11) Plan: Discharge SLP treatment due to (comment) (tolerating current diet; unlikely to upgrade; d/c to SNF)                     Joan Herschberger P. Keiosha Cancro, M.S., CCC-SLP Speech-Language Pathologist Acute Rehabilitation Services Pager: [phone]  Susanne Borders Rayvn Rickerson 06/12/2020, 1:42 PM

## 2020-06-12 NOTE — Discharge Instructions (Signed)
Orthopaedic Trauma Service Discharge Instructions   General Discharge Instructions  Orthopaedic Injuries:  Left femoral neck fracture treated with left hip hemiarthroplasty   WEIGHT BEARING STATUS: weightbearing as tolerated left leg with assistance   RANGE OF MOTION/ACTIVITY: posterior hip precautions left hip   Bone health: continue with vitamin d supplementation. Femoral neck fracture from a ground level fall is diagnostic for osteoporosis   Wound Care: daily wound care as needed to L hip.  Can clean wound with soap and water    Discharge Wound Care Instructions  Do NOT apply any ointments, solutions or lotions to pin sites or surgical wounds.  These prevent needed drainage and even though solutions like hydrogen peroxide kill bacteria, they also damage cells lining the pin sites that help fight infection.  Applying lotions or ointments can keep the wounds moist and can cause them to breakdown and open up as well. This can increase the risk for infection. When in doubt call the office.  Surgical incisions should be dressed daily.  If any drainage is noted, use one layer of adaptic, then gauze, Kerlix, and an ace wrap.  Once the incision is completely dry and without drainage, it may be left open to air out.  Showering may begin 36-48 hours later.  Cleaning gently with soap and water.  Traumatic wounds should be dressed daily as well.    One layer of adaptic, gauze, Kerlix, then ace wrap.  The adaptic can be discontinued once the draining has ceased    If you have a wet to dry dressing: wet the gauze with saline the squeeze as much saline out so the gauze is moist (not soaking wet), place moistened gauze over wound, then place a dry gauze over the moist one, followed by Kerlix wrap, then ace wrap.   DVT/PE prophylaxis: Lovenox injection daily x 28 days   Diet: as you were eating previously.  Can use over the counter stool softeners and bowel preparations, such as Miralax, to  help with bowel movements.  Narcotics can be constipating.  Be sure to drink plenty of fluids  PAIN MEDICATION USE AND EXPECTATIONS  You have likely been given narcotic medications to help control your pain.  After a traumatic event that results in an fracture (broken bone) with or without surgery, it is ok to use narcotic pain medications to help control one's pain.  We understand that everyone responds to pain differently and each individual patient will be evaluated on a regular basis for the continued need for narcotic medications. Ideally, narcotic medication use should last no more than 6-8 weeks (coinciding with fracture healing).   As a patient it is your responsibility as well to monitor narcotic medication use and report the amount and frequency you use these medications when you come to your office visit.   We would also advise that if you are using narcotic medications, you should take a dose prior to therapy to maximize you participation.  IF YOU ARE ON NARCOTIC MEDICATIONS IT IS NOT PERMISSIBLE TO OPERATE A MOTOR VEHICLE (MOTORCYCLE/CAR/TRUCK/MOPED) OR HEAVY MACHINERY DO NOT MIX NARCOTICS WITH OTHER CNS (CENTRAL NERVOUS SYSTEM) DEPRESSANTS SUCH AS ALCOHOL   STOP SMOKING OR USING NICOTINE PRODUCTS!!!!  As discussed nicotine severely impairs your body's ability to heal surgical and traumatic wounds but also impairs bone healing.  Wounds and bone heal by forming microscopic blood vessels (angiogenesis) and nicotine is a vasoconstrictor (essentially, shrinks blood vessels).  Therefore, if vasoconstriction occurs to these microscopic blood vessels  they essentially disappear and are unable to deliver necessary nutrients to the healing tissue.  This is one modifiable factor that you can do to dramatically increase your chances of healing your injury.    (This means no smoking, no nicotine gum, patches, etc)  DO NOT USE NONSTEROIDAL ANTI-INFLAMMATORY DRUGS (NSAID'S)  Using products such as  Advil (ibuprofen), Aleve (naproxen), Motrin (ibuprofen) for additional pain control during fracture healing can delay and/or prevent the healing response.  If you would like to take over the counter (OTC) medication, Tylenol (acetaminophen) is ok.  However, some narcotic medications that are given for pain control contain acetaminophen as well. Therefore, you should not exceed more than 4000 mg of tylenol in a day if you do not have liver disease.  Also note that there are may OTC medicines, such as cold medicines and allergy medicines that my contain tylenol as well.  If you have any questions about medications and/or interactions please ask your doctor/PA or your pharmacist.      ICE AND ELEVATE INJURED/OPERATIVE EXTREMITY  Using ice and elevating the injured extremity above your heart can help with swelling and pain control.  Icing in a pulsatile fashion, such as 20 minutes on and 20 minutes off, can be followed.    Do not place ice directly on skin. Make sure there is a barrier between to skin and the ice pack.    Using frozen items such as frozen peas works well as the conform nicely to the are that needs to be iced.  USE AN ACE WRAP OR TED HOSE FOR SWELLING CONTROL  In addition to icing and elevation, Ace wraps or TED hose are used to help limit and resolve swelling.  It is recommended to use Ace wraps or TED hose until you are informed to stop.    When using Ace Wraps start the wrapping distally (farthest away from the body) and wrap proximally (closer to the body)   Example: If you had surgery on your leg or thing and you do not have a splint on, start the ace wrap at the toes and work your way up to the thigh        If you had surgery on your upper extremity and do not have a splint on, start the ace wrap at your fingers and work your way up to the upper arm  IF YOU ARE IN A SPLINT OR CAST DO NOT REMOVE IT FOR ANY REASON   If your splint gets wet for any reason please contact the office  immediately. You may shower in your splint or cast as long as you keep it dry.  This can be done by wrapping in a cast cover or garbage back (or similar)  Do Not stick any thing down your splint or cast such as pencils, money, or hangers to try and scratch yourself with.  If you feel itchy take benadryl as prescribed on the bottle for itching  IF YOU ARE IN A CAM BOOT (BLACK BOOT)  You may remove boot periodically. Perform daily dressing changes as noted below.  Wash the liner of the boot regularly and wear a sock when wearing the boot. It is recommended that you sleep in the boot until told otherwise    Call office for the following:  Temperature greater than 101F  Persistent nausea and vomiting  Severe uncontrolled pain  Redness, tenderness, or signs of infection (pain, swelling, redness, odor or green/yellow discharge around the site)  Difficulty breathing,  headache or visual disturbances  Hives  Persistent dizziness or light-headedness  Extreme fatigue  Any other questions or concerns you may have after discharge  In an emergency, call 911 or go to an Emergency Department at a nearby hospital   CALL THE OFFICE WITH ANY QUESTIONS OR CONCERNS: [phone]   VISIT OUR WEBSITE FOR ADDITIONAL INFORMATION: [email or web address]

## 2020-06-13 DIAGNOSIS — S72001A Fracture of unspecified part of neck of right femur, initial encounter for closed fracture: Secondary | ICD-10-CM

## 2020-06-13 LAB — CBC WITH DIFFERENTIAL/PLATELET
Abs Immature Granulocytes: 0.02 10*3/uL (ref 0.00–0.07)
Basophils Absolute: 0.1 10*3/uL (ref 0.0–0.1)
Basophils Relative: 1 %
Eosinophils Absolute: 0.2 10*3/uL (ref 0.0–0.5)
Eosinophils Relative: 4 %
HCT: 25.9 % — ABNORMAL LOW (ref 36.0–46.0)
Hemoglobin: 8 g/dL — ABNORMAL LOW (ref 12.0–15.0)
Immature Granulocytes: 0 %
Lymphocytes Relative: 25 %
Lymphs Abs: 1.4 10*3/uL (ref 0.7–4.0)
MCH: 28.2 pg (ref 26.0–34.0)
MCHC: 30.9 g/dL (ref 30.0–36.0)
MCV: 91.2 fL (ref 80.0–100.0)
Monocytes Absolute: 0.5 10*3/uL (ref 0.1–1.0)
Monocytes Relative: 10 %
Neutro Abs: 3.3 10*3/uL (ref 1.7–7.7)
Neutrophils Relative %: 60 %
Platelets: 248 10*3/uL (ref 150–400)
RBC: 2.84 MIL/uL — ABNORMAL LOW (ref 3.87–5.11)
RDW: 14.6 % (ref 11.5–15.5)
WBC: 5.5 10*3/uL (ref 4.0–10.5)
nRBC: 0 % (ref 0.0–0.2)

## 2020-06-13 LAB — COMPREHENSIVE METABOLIC PANEL
ALT: 8 U/L (ref 0–44)
AST: 32 U/L (ref 15–41)
Albumin: 3.2 g/dL — ABNORMAL LOW (ref 3.5–5.0)
Alkaline Phosphatase: 42 U/L (ref 38–126)
Anion gap: 10 (ref 5–15)
BUN: 25 mg/dL — ABNORMAL HIGH (ref 8–23)
CO2: 31 mmol/L (ref 22–32)
Calcium: 8.4 mg/dL — ABNORMAL LOW (ref 8.9–10.3)
Chloride: 101 mmol/L (ref 98–111)
Creatinine, Ser: 0.93 mg/dL (ref 0.44–1.00)
GFR calc Af Amer: >60 mL/min (ref >60)
GFR calc non Af Amer: 55 mL/min — ABNORMAL LOW (ref >60)
Glucose, Bld: 104 mg/dL — ABNORMAL HIGH (ref 70–99)
Potassium: 3.3 mmol/L — ABNORMAL LOW (ref 3.5–5.1)
Sodium: 142 mmol/L (ref 135–145)
Total Bilirubin: 0.9 mg/dL (ref 0.3–1.2)
Total Protein: 6.3 g/dL — ABNORMAL LOW (ref 6.5–8.1)

## 2020-06-13 LAB — HEMOGLOBIN AND HEMATOCRIT, BLOOD
HCT: 25 % — ABNORMAL LOW (ref 36.0–46.0)
HCT: 28.1 % — ABNORMAL LOW (ref 36.0–46.0)
Hemoglobin: 7.7 g/dL — ABNORMAL LOW (ref 12.0–15.0)
Hemoglobin: 8.7 g/dL — ABNORMAL LOW (ref 12.0–15.0)

## 2020-06-13 LAB — PHOSPHORUS: Phosphorus: 3 mg/dL (ref 2.5–4.6)

## 2020-06-13 LAB — MAGNESIUM: Magnesium: 2.1 mg/dL (ref 1.7–2.4)

## 2020-06-13 MED ORDER — FERROUS SULFATE 325 (65 FE) MG PO TABS: 325.0000 mg | ORAL_TABLET | Freq: Every day | ORAL | 0 refills | Status: AC

## 2020-06-13 MED ORDER — DOCUSATE SODIUM 100 MG PO CAPS: 100.0000 mg | ORAL_CAPSULE | Freq: Two times a day (BID) | ORAL | 0 refills | Status: AC

## 2020-06-13 MED ORDER — LORAZEPAM 0.5 MG PO TABS
0.5000 mg | ORAL_TABLET | Freq: Three times a day (TID) | ORAL | 0 refills | Status: AC | PRN
Start: 2020-06-13 — End: 2021-06-13

## 2020-06-13 MED ORDER — ONDANSETRON HCL 4 MG PO TABS: 4.0000 mg | ORAL_TABLET | Freq: Four times a day (QID) | ORAL | 0 refills | Status: AC | PRN

## 2020-06-13 MED ORDER — ASCORBIC ACID 500 MG PO TABS
500.0000 mg | ORAL_TABLET | Freq: Every day | ORAL | Status: DC
  Administered 2020-06-13: 500 mg via ORAL
  Filled 2020-06-13: qty 1

## 2020-06-13 MED ORDER — FERROUS SULFATE 325 (65 FE) MG PO TABS
325.0000 mg | ORAL_TABLET | Freq: Every day | ORAL | Status: DC
  Administered 2020-06-13: 325 mg via ORAL
  Filled 2020-06-13 (×2): qty 1

## 2020-06-13 MED ORDER — ASCORBIC ACID 500 MG PO TABS: 500.0000 mg | ORAL_TABLET | Freq: Every day | ORAL | 0 refills | Status: AC

## 2020-06-13 NOTE — Progress Notes (Signed)
Pt transferred via PTAR to facility.  Currently pt stable and in no distress.  IV site d/c with no issues. PT's DTR at bedside.

## 2020-06-13 NOTE — Progress Notes (Signed)
Occupational Therapy Treatment Patient Details Name: Laurie Humphrey MRN: [id number] DOB: 06/18/1929 Today's Date: 06/13/2020    History of present illness Pt is an 86 yo female s/p knee pain and inability to bear wt on LLE, s/p fall history requiring L hemiarthroplasty. PMHx: multiple falls, osteoporosis, dementia. Pt came from ALF.   OT comments  Pt seen for additional OT session at request of NT for toilet transfer. Pt required max A to pivot onto [facility] with multimodal cues for safety and problem solving. Pt requiring total A for peri care after urination (max A to maintain standing and total A from second person for hygiene). Educated Engineer, manufacturing on safe pivot transfers for pt due to cognitive status and how to manage OOB activity. D/c recs remain appropriate, will continue to follow.   Follow Up Recommendations  SNF;Supervision/Assistance - 24 hour    Equipment Recommendations  None recommended by OT    Recommendations for Other Services      Precautions / Restrictions Precautions Precautions: Fall;Posterior Hip Precaution Booklet Issued: No Precaution Comments: due to baseline dementia, pt having difficulty comprehending posterior precautions Required Braces or Orthoses: Knee Immobilizer - Left Restrictions Weight Bearing Restrictions: Yes LLE Weight Bearing: Weight bearing as tolerated Other Position/Activity Restrictions: posterior hip precautions       Mobility Bed Mobility Overal bed mobility: Needs Assistance Bed Mobility: Supine to Sit       Sit to supine: Max assist;+2 for physical assistance   General bed mobility comments: up in chair  Transfers Overall transfer level: Needs assistance Equipment used: 2 person hand held assist Transfers: Sit to/from [employer] Sit to Stand: Mod assist;+2 safety/equipment Stand pivot transfers: Max assist       General transfer comment: max A pivot to [facility] from recliner    Balance Overall balance  assessment: Needs assistance;History of Falls Sitting-balance support: Feet supported;Bilateral upper extremity supported Sitting balance-Leahy Scale: Poor Sitting balance - Comments: MinA for sitting; posterior lean present with cues to sit upright   Standing balance support: During functional activity Standing balance-Leahy Scale: Poor Standing balance comment: Requires UE support and Min A for balance. Posterior bias.                           ADL either performed or assessed with clinical judgement   ADL Overall ADL's : Needs assistance/impaired Eating/Feeding: Moderate assistance;Sitting Eating/Feeding Details (indicate cue type and reason): multimodal cues to continue to engage in meal. Once given fork, placed in hand, and given initiation cues pt able to engage in meal for ~2-3 minutes                 Lower Body Dressing: Total assistance;Sitting/lateral leans;Sit to/from stand   Toilet Transfer: Maximal assistance;Stand-pivot;BSC Toilet Transfer Details (indicate cue type and reason): max A pivot to [facility] without RW Toileting- Clothing Manipulation and Hygiene: Total assistance;Sitting/lateral lean;Sit to/from stand       Functional mobility during ADLs: Moderate assistance;+2 for physical assistance;+2 for safety/equipment;Cueing for safety;Cueing for sequencing       Vision Patient Visual Report: No change from baseline     Perception     Praxis      Cognition Arousal/Alertness: Awake/alert Behavior During Therapy: WFL for tasks assessed/performed Overall Cognitive Status: History of cognitive impairments - at baseline  General Comments: dementia at baseline, difficulty problem solving        Exercises     Shoulder Instructions       General Comments VSS on RA    Pertinent Vitals/ Pain       Pain Assessment: No/denies pain  Home Living                                           Prior Functioning/Environment              Frequency  Min 2X/week        Progress Toward Goals  OT Goals(current goals can now be found in the care plan section)  Progress towards OT goals: Progressing toward goals  Acute Rehab OT Goals Patient Stated Goal: to use the bathroom and then go home and lay in my own bed OT Goal Formulation: With patient Time For Goal Achievement: 06/24/20 Potential to Achieve Goals: Good  Plan Discharge plan remains appropriate    Co-evaluation                 AM-PAC OT "6 Clicks" Daily Activity     Outcome Measure   Help from another person eating meals?: A Lot Help from another person taking care of personal grooming?: A Lot Help from another person toileting, which includes using toliet, bedpan, or urinal?: A Lot Help from another person bathing (including washing, rinsing, drying)?: Total Help from another person to put on and taking off regular upper body clothing?: A Lot Help from another person to put on and taking off regular lower body clothing?: Total 6 Click Score: 10    End of Session Equipment Utilized During Treatment: Gait belt  OT Visit Diagnosis: Unsteadiness on feet (R26.81);Muscle weakness (generalized) (M62.81);Pain;Other symptoms and signs involving cognitive function Pain - Right/Left: Left Pain - part of body: Leg   Activity Tolerance Patient tolerated treatment well   Patient Left in bed;with call bell/phone within reach;with chair alarm set   Nurse Communication Mobility status        Time: 1010-1020 OT Time Calculation (min): 10 min  Charges: OT General Charges $OT Visit: 1 Visit OT Treatments $Self Care/Home Management : 8-22 mins  Dalphine Handing, MSOT, OTR/L Acute Rehabilitation Services [facility] Office Number: [phone] Pager: [phone]  Dalphine Handing 06/13/2020, 10:58 AM

## 2020-06-13 NOTE — Discharge Summary (Signed)
Physician Discharge Summary  Laurie Humphrey ZSW:[id number] DOB: 1929/05/06 DOA: 06/08/2020  PCP: [name], MD  Admit date: 06/08/2020 Discharge date: 06/13/2020  Time spent: 30 minutes  Recommendations for Outpatient Follow-up: Left femoral neck fracture -Likely related to a recent fall -Transfer to [facility] for further evaluation per orthopedics -2/19 LEFT hip repaired with DePuy [employer] Basic #39femoral stem, standard neck, 73mm head -Pain management as ordered  Hypothyroidism -Levothyroxine on 75 mcg daily  Dementia/Agitation -7/23  Ativan PO 0.5 MG  TID PRN -Depakote 125 mg BID -Lexapro 10 mg daily -Seroquel 25 mg daily  GERD -PPI  History of complete heart block with pacemaker -Stable  Mixed anemia  -Hemoglobin stable Recent Labs  Lab 06/11/20 2305 06/12/20 0232 06/12/20 1418 06/12/20 2327 06/13/20 0213  HGB 7.8* 7.5* 8.1* 7.7* 8.0*  -Anemia panel most consistent mixed.  FE/TIBC ratio =0.03 & MVC 91.2 -Start ferrous sulfate 325 mg daily + vitamin C 500 mg daily -Occult blood negative  Moderate protein calorie malnutrition -Encourage p.o. intake -Prosource 30 ml BID  Constipation -7/22 smog enema -7/22 bisacodyl 5 mg x 1 -7/23 patient had BM overnight stable for discharge.   Discharge Diagnoses:  Active Problems:   Fracture of femoral neck, left (HCC)   Vitamin D insufficiency   Malnutrition of moderate degree   Discharge Condition: Stable  Diet recommendation: Dysphagia 1 fluid consistency thin  Filed Weights   06/08/20 0927  Weight: 49.9 kg    History of present illness:  Laurie Humphrey a 86 y.o.WF PMHx Dementia, hypothyroidism, osteoporosis, Complete Heart Block s/p pacemaker placement   Presented from her assisted living facility with complaints of left knee pain today. She is normally ambulatory at baseline but had refused to put weight on her left leg today. Apparently there were no recent falls documented, but she  is noted to fall quite frequently and has had about 4 falls recently. She does not complain of hip or low back pain and states that her knee bothers her especially with movement. Further history cannot be obtained from the patient as she is a poor historian on account of her dementia.  ED Course:Stable vital signs noted. Laboratory data with CBC thus far with no acute abnormalities. Covid testing negative. Patient appears comfortable. Agreeable for transfer for orthopedic evaluation. EDP has spoken with Dr. Victorino Dike with orthopedics who will assess in a.m.  Hospital Course:  See Above   Procedures: 2/19 DISPLACED LEFTFEMORAL NECK FRACTURE;UNIPOLAR HEMIARTHROPLASTY OF THELEFTHIP with DePuy [place] Basic #45femoral stem, standard neck, 53mm head   Consultations: Orthopedic surgery    Discharge Exam: Vitals:   06/12/20 0533 06/12/20 1512 06/12/20 2138 06/13/20 0549  BP: 128/68 (!) 132/66 (!) 166/64 109/79  Pulse: 73 69 68 68  Resp: 17 16 17 17   Temp: 98.2 F (36.8 C) 98.3 F (36.8 C) 99.3 F (37.4 C) 98.1 F (36.7 C)  TempSrc:  Oral Oral Oral  SpO2: 100% 100% 95% (!) 84%  Weight:      Height:        General: A/O x1 (does not know where, when, why), No acute respiratory distress Eyes: negative scleral hemorrhage, negative anisocoria, negative icterus ENT: Negative Runny nose, negative gingival bleeding, Neck:  Negative scars, masses, torticollis, lymphadenopathy, JVD Lungs: Clear to auscultation bilaterally without wheezes or crackles Cardiovascular: Regular rate and rhythm without murmur gallop or rub normal S1 and S2 Abdomen: negative abdominal pain, nondistended, positive soft, bowel sounds   Discharge Instructions   Allergies as of  06/13/2020      Reactions   Zyprexa [olanzapine]    Insomnia, hallucination, neurotic behavior   Sulfa Antibiotics    Childhood allergy, unknown      Medication List    STOP taking these medications   ALPRAZolam 0.5 MG  tablet Commonly known as: XANAX     TAKE these medications   (feeding supplement) PROSource Plus liquid Take 30 mLs by mouth 2 (two) times daily between meals.   acetaminophen 500 MG tablet Commonly known as: TYLENOL Take 2 tablets (1,000 mg total) by mouth every 8 (eight) hours for 21 days. What changed:   medication strength  how much to take  when to take this   ascorbic acid 500 MG tablet Commonly known as: VITAMIN C Take 1 tablet (500 mg total) by mouth daily.   celecoxib 100 MG capsule Commonly known as: CELEBREX Take 100 mg by mouth daily.   divalproex 125 MG DR tablet Commonly known as: DEPAKOTE Take 125 mg by mouth 2 (two) times daily.   docusate sodium 100 MG capsule Commonly known as: COLACE Take 1 capsule (100 mg total) by mouth 2 (two) times daily.   enoxaparin 30 MG/0.3ML injection Commonly known as: LOVENOX Inject 0.3 mLs (30 mg total) into the skin daily for 28 days.   escitalopram 10 MG tablet Commonly known as: LEXAPRO Take 10 mg by mouth daily.   ferrous sulfate 325 (65 FE) MG tablet Take 1 tablet (325 mg total) by mouth daily with breakfast.   levothyroxine 75 MCG tablet Commonly known as: SYNTHROID Take 1 tablet (75 mcg total) by mouth daily before breakfast.   LORazepam 0.5 MG tablet Commonly known as: Ativan Take 1 tablet (0.5 mg total) by mouth every 8 (eight) hours as needed for anxiety.   melatonin 3 MG Tabs tablet Take by mouth at bedtime.   multivitamin with minerals Tabs tablet Take 1 tablet by mouth daily.   ondansetron 4 MG tablet Commonly known as: ZOFRAN Take 1 tablet (4 mg total) by mouth every 6 (six) hours as needed for nausea. What changed:   when to take this  reasons to take this   pantoprazole 40 MG tablet Commonly known as: PROTONIX Take 40 mg by mouth daily.   QUEtiapine 25 MG tablet Commonly known as: SEROQUEL at bedtime.   Vitamin D3 25 MCG tablet Commonly known as: Vitamin D Take 2 tablets  (2,000 Units total) by mouth 2 (two) times daily.      Allergies  Allergen Reactions  . Zyprexa [Olanzapine]     Insomnia, hallucination, neurotic behavior  . Sulfa Antibiotics     Childhood allergy, unknown    Follow-up Information    Myrene GalasHandy, Michael, MD. Schedule an appointment as soon as possible for a visit in 2 week(s).   Specialty: Orthopedic Surgery Contact information: [address] [place] [address] [phone]                The results of significant diagnostics from this hospitalization (including imaging, microbiology, ancillary and laboratory) are listed below for reference.    Significant Diagnostic Studies: DG Chest Port 1 View  Result Date: 06/08/2020 CLINICAL DATA:  The patient suffered a left hip fracture in a fall today. Preoperative exam. EXAM: PORTABLE CHEST 1 VIEW COMPARISON:  PA and lateral chest 05/15/2020. FINDINGS: The lungs are hyperexpanded but clear. Heart size is normal. Aortic atherosclerosis. No pneumothorax or pleural effusion. Pacing device is in place. IMPRESSION: No acute disease. Aortic Atherosclerosis (ICD10-I70.0)  and Emphysema (ICD10-J43.9). Electronically Signed   By: Drusilla Kanner M.D.   On: 06/08/2020 16:34   DG Knee Complete 4 Views Left  Result Date: 06/08/2020 CLINICAL DATA:  Pain. Patient with dementia. Unknown trauma history. EXAM: LEFT KNEE - COMPLETE 4+ VIEW COMPARISON:  None. FINDINGS: No acute fracture or dislocation. No joint effusion. Mild osteopenia. Joint spaces are maintained for age. Mild degenerate sclerosis involves the femoral condyles. IMPRESSION: No acute osseous abnormality. Electronically Signed   By: Jeronimo Greaves M.D.   On: 06/08/2020 11:10   DG HIP PORT UNILAT WITH PELVIS 1V LEFT  Result Date: 06/09/2020 CLINICAL DATA:  Status post left hip hemiarthroplasty EXAM: DG HIP (WITH OR WITHOUT PELVIS) 1V PORT LEFT COMPARISON:  None. FINDINGS: Status post left hip hemiarthroplasty. There is postoperative  gas surrounding the proximal femur. Prosthetic components are in expected position. IMPRESSION: Expected postoperative appearance of left hip hemiarthroplasty. Electronically Signed   By: Deatra Robinson M.D.   On: 06/09/2020 20:23   DG Hip Unilat W or Wo Pelvis 2-3 Views Left  Result Date: 06/08/2020 CLINICAL DATA:  Knee pain. Unable to bear weight. EXAM: DG HIP (WITH OR WITHOUT PELVIS) 2-3V LEFT COMPARISON:  None. FINDINGS: There is a impacted sub capitellar displaced left femoral neck fracture with superior displacement of the distal fracture fragment. The acetabulum in the visualized pelvic bones are intact. Constipation. IMPRESSION: Impacted sub capitellar displaced left femoral neck fracture. Electronically Signed   By: Ted Mcalpine M.D.   On: 06/08/2020 12:29    Microbiology: Recent Results (from the past 240 hour(s))  SARS Coronavirus 2 by RT PCR (hospital order, performed in [facility] hospital lab) Nasopharyngeal Nasopharyngeal Swab     Status: None   Collection Time: 06/08/20  2:22 PM   Specimen: Nasopharyngeal Swab  Result Value Ref Range Status   SARS Coronavirus 2 NEGATIVE NEGATIVE Final    Comment: (NOTE) SARS-CoV-2 target nucleic acids are NOT DETECTED.  The SARS-CoV-2 RNA is generally detectable in upper and lower respiratory specimens during the acute phase of infection. The lowest concentration of SARS-CoV-2 viral copies this assay can detect is 250 copies / mL. A negative result does not preclude SARS-CoV-2 infection and should not be used as the sole basis for treatment or other patient management decisions.  A negative result may occur with improper specimen collection / handling, submission of specimen other than nasopharyngeal swab, presence of viral mutation(s) within the areas targeted by this assay, and inadequate number of viral copies (<250 copies / mL). A negative result must be combined with clinical observations, patient history, and epidemiological  information.  Fact Sheet for Patients:   BoilerBrush.com.cy  Fact Sheet for Healthcare Providers: https://pope.com/  This test is not yet approved or  cleared by the [place] FDA and has been authorized for detection and/or diagnosis of SARS-CoV-2 by FDA under an Emergency Use Authorization (EUA).  This EUA will remain in effect (meaning this test can be used) for the duration of the COVID-19 declaration under Section 564(b)(1) of the Act, 21 U.S.C. section 360bbb-3(b)(1), unless the authorization is terminated or revoked sooner.  Performed at [facility], [address], [place], [place] [address]   MRSA PCR Screening     Status: None   Collection Time: 06/09/20 12:28 PM   Specimen: Nasal Mucosa; Nasopharyngeal  Result Value Ref Range Status   MRSA by PCR NEGATIVE NEGATIVE Final    Comment:        The GeneXpert MRSA Assay (FDA  approved for NASAL specimens only), is one component of a comprehensive MRSA colonization surveillance program. [facility] to diagnose MRSA infection nor to guide or monitor treatment for MRSA infections. Performed at [facility], [address], [place], [place] [address]   SARS CORONAVIRUS 2 (TAT 6-24 HRS) Nasopharyngeal Nasopharyngeal Swab     Status: None   Collection Time: 06/11/20 11:55 AM   Specimen: Nasopharyngeal Swab  Result Value Ref Range Status   SARS Coronavirus 2 NEGATIVE NEGATIVE Final    Comment: (NOTE) SARS-CoV-2 target nucleic acids are NOT DETECTED.  The SARS-CoV-2 RNA is generally detectable in upper and lower respiratory specimens during the acute phase of infection. Negative results do not preclude SARS-CoV-2 infection, do not rule out co-infections with other pathogens, and should not be used as the sole basis for treatment or other patient management decisions. Negative results must be combined with clinical observations, patient history, and  epidemiological information. The expected result is Negative.  Fact Sheet for Patients: HairSlick.no  Fact Sheet for Healthcare Providers: quierodirigir.com  This test is not yet approved or cleared by the [place] FDA and  has been authorized for detection and/or diagnosis of SARS-CoV-2 by FDA under an Emergency Use Authorization (EUA). This EUA will remain  in effect (meaning this test can be used) for the duration of the COVID-19 declaration under Se ction 564(b)(1) of the Act, 21 U.S.C. section 360bbb-3(b)(1), unless the authorization is terminated or revoked sooner.  Performed at [facility], [address] [place], [place] [address]      Labs: Basic Metabolic Panel: Recent Labs  Lab 06/08/20 1540 06/08/20 1540 06/09/20 2313 06/10/20 0207 06/11/20 0151 06/12/20 0232 06/13/20 0213  NA 139  --   --  138 139 140 142  K 4.0  --   --  3.7 4.4 3.9 3.3*  CL 102  --   --  99 100 102 101  CO2 27  --   --  GLUCOSE 98  --   --  202* 111* 94 104*  BUN 22  --   --  23 33* 24* 25*  CREATININE 1.04*   < > 1.11* 1.19* 1.47* 1.08* 0.93  CALCIUM 8.2*  --   --  8.3* 8.0* 8.0* 8.4*  MG  --   --   --   --   --  2.0 2.1  PHOS  --   --   --   --   --  2.8 3.0   < > = values in this interval not displayed.   Liver Function Tests: Recent Labs  Lab 06/12/20 0232 06/13/20 0213  AST 34 32  ALT 9 8  ALKPHOS 35* 42  BILITOT 0.8 0.9  PROT 5.6* 6.3*  ALBUMIN 2.9* 3.2*   No results for input(s): LIPASE, AMYLASE in the last 168 hours. No results for input(s): AMMONIA in the last 168 hours. CBC: Recent Labs  Lab 06/08/20 1540 06/08/20 1540 06/09/20 2313 06/09/20 2313 06/10/20 0207 06/10/20 0207 06/11/20 0151 06/11/20 1158 06/11/20 2305 06/12/20 0232 06/12/20 1418 06/12/20 2327 06/13/20 0213  WBC 7.6   < > 11.1*  --  12.2*  --  6.5  --   --  5.9  --   --  5.5  NEUTROABS 5.6  --   --   --   --    --   --   --   --  3.6  --   --  3.3  HGB 10.6*   < > 10.0*   < > 9.8*   < > 7.7*   < > 7.8* 7.5* 8.1* 7.7* 8.0*  HCT 33.8*   < > 32.0*   < > 31.2*   < > 24.1*   < > 24.7* 23.5* 25.5* 25.0* 25.9*  MCV 91.1   < > 90.7  --  89.4  --  89.9  --   --  90.4  --   --  91.2  PLT 227   < > 232  --  256  --  202  --   --  213  --   --  248   < > = values in this interval not displayed.   Cardiac Enzymes: No results for input(s): CKTOTAL, CKMB, CKMBINDEX, TROPONINI in the last 168 hours. BNP: BNP (last 3 results) No results for input(s): BNP in the last 8760 hours.  ProBNP (last 3 results) No results for input(s): PROBNP in the last 8760 hours.  CBG: No results for input(s): GLUCAP in the last 168 hours.     Signed:  Carolyne Littles, MD [facility] [phone] pager

## 2020-06-13 NOTE — Progress Notes (Signed)
Occupational Therapy Treatment Patient Details Name: Laurie Humphrey MRN: [id number] DOB: June 07, 1929 Today's Date: 06/13/2020    History of present illness Pt is an 86 yo female s/p knee pain and inability to bear wt on LLE, s/p fall history requiring L hemiarthroplasty. PMHx: multiple falls, osteoporosis, dementia. Pt came from ALF.   OT comments  Pt progressing toward stated goals. She remains limited in learning adaptive strategies for dx due to baseline dementia. Pt completed bed mobility at max A +2 and sit <> stands with mod A +2 and RW. Pt having difficulty problem solving RW usage and becoming tearful, anxious and fearful to fall. She became resistant during transfer, requiring assist to sit down. Once seated, RW removed for pivot transfers. She was able to pivot from bed <>BSC<> recliner with max A. D/c recs remain appropriate, will continue to follow.   Follow Up Recommendations  SNF;Supervision/Assistance - 24 hour    Equipment Recommendations  None recommended by OT    Recommendations for Other Services      Precautions / Restrictions Precautions Precautions: Fall;Posterior Hip Precaution Booklet Issued: No Precaution Comments: due to baseline dementia, pt having difficulty comprehending posterior precautions Required Braces or Orthoses: Knee Immobilizer - Left Restrictions Weight Bearing Restrictions: Yes LLE Weight Bearing: Weight bearing as tolerated Other Position/Activity Restrictions: posterior hip precautions       Mobility Bed Mobility Overal bed mobility: Needs Assistance Bed Mobility: Supine to Sit       Sit to supine: Max assist;+2 for physical assistance   General bed mobility comments: max A +2 for safety and assistance to problem solve/sequence transfer. Assist for BLE management and trunk elevation  Transfers Overall transfer level: Needs assistance Equipment used: Rolling walker (2 wheeled);2 person hand held assist Transfers: Sit to/from  [employer] Sit to Stand: Mod assist;+2 safety/equipment Stand pivot transfers: Max assist;+2 physical assistance       General transfer comment: mod A +2 to rise and steady with RW; increased assist to pivot due to fear of falling and cognition limiting. Walker ultimately removed from transfer and pt assisted with pivot    Balance Overall balance assessment: Needs assistance;History of Falls Sitting-balance support: Feet supported;Bilateral upper extremity supported Sitting balance-Leahy Scale: Poor Sitting balance - Comments: MinA for sitting; posterior lean present with cues to sit upright   Standing balance support: During functional activity   Standing balance comment: Requires UE support and Min A for balance. Posterior bias.                           ADL either performed or assessed with clinical judgement   ADL Overall ADL's : Needs assistance/impaired Eating/Feeding: Moderate assistance;Sitting Eating/Feeding Details (indicate cue type and reason): multimodal cues to continue to engage in meal. Once given fork, placed in hand, and given initiation cues pt able to engage in meal for ~2-3 minutes                 Lower Body Dressing: Total assistance;Sitting/lateral leans;Sit to/from stand   Toilet Transfer: Moderate assistance;+2 for physical assistance;+2 for safety/equipment;Stand-pivot;RW Toilet Transfer Details (indicate cue type and reason): mod A +2 to maintain safety. Pt having difficulty problem solving transfer and becoming tearfula nd fearful of falling. Ultimately walker taken away and pt pivoted. She was resitive to transfer due to fear of fall Toileting- Architect and Hygiene: Total assistance;Sitting/lateral lean;Sit to/from stand       Functional mobility during ADLs:  Moderate assistance;+2 for physical assistance;+2 for safety/equipment;Cueing for safety;Cueing for sequencing       Vision Patient Visual Report:  No change from baseline     Perception     Praxis      Cognition                                                Exercises     Shoulder Instructions       General Comments VSS on RA    Pertinent Vitals/ Pain          Home Living                                          Prior Functioning/Environment              Frequency  Min 2X/week        Progress Toward Goals  OT Goals(current goals can now be found in the care plan section)     Acute Rehab OT Goals Patient Stated Goal: to use the bathroom and then go home and lay in my own bed OT Goal Formulation: With patient Time For Goal Achievement: 06/24/20 Potential to Achieve Goals: Good  Plan      Co-evaluation                 AM-PAC OT "6 Clicks" Daily Activity     Outcome Measure   Help from another person eating meals?: A Lot Help from another person taking care of personal grooming?: A Lot Help from another person toileting, which includes using toliet, bedpan, or urinal?: A Lot Help from another person bathing (including washing, rinsing, drying)?: Total Help from another person to put on and taking off regular upper body clothing?: A Lot Help from another person to put on and taking off regular lower body clothing?: Total 6 Click Score: 10    End of Session Equipment Utilized During Treatment: Gait belt;Rolling walker  OT Visit Diagnosis: Unsteadiness on feet (R26.81);Muscle weakness (generalized) (M62.81);Pain;Other symptoms and signs involving cognitive function Pain - Right/Left: Left Pain - part of body: Leg   Activity Tolerance Patient tolerated treatment well   Patient Left in chair;with call bell/phone within reach;with chair alarm set   Nurse Communication Mobility status        Time: 1950-9326 OT Time Calculation (min): 30 min  Charges: OT General Charges $OT Visit: 1 Visit OT Treatments $Self Care/Home Management : 23-37  mins  Dalphine Handing, MSOT, OTR/L Acute Rehabilitation Services [facility] Office Number: [phone] Pager: [phone]  Dalphine Handing 06/13/2020, 10:44 AM

## 2020-06-13 NOTE — Progress Notes (Signed)
Report given to pam at [facility]

## 2020-06-13 NOTE — Social Work (Signed)
Clinical Social Worker facilitated patient discharge including contacting patient family and facility to confirm patient discharge plans.  Clinical information faxed to facility and family agreeable with plan.  CSW arranged ambulance transport via [facility] to [facility] RN to call [phone] (ask for [facility])  with report prior to discharge.  Clinical Social Worker will sign off for now as social work intervention is no longer needed. Please consult [place] again if new need arises.  Octavio Graves, MSW, LCSW Clinical Social Worker

## 2020-06-13 NOTE — Progress Notes (Signed)
Message left for pam at [facility], awaiting call back at this time

## 2020-06-13 NOTE — TOC Transition Note (Signed)
Transition of Care [facility]) - CM/SW Discharge Note   Patient Details  Name: Laurie Humphrey MRN: [id number] Date of Birth: 1929/09/09  Transition of Care [facility] CM/SW Contact:  Doy Hutching, LCSW Phone Number: 06/13/2020, 12:35 PM   Clinical Narrative:    CSW spoke with pt daughter [name], pt stable for d/c per MD. Documentation sent to SNF, [facility] papers and scripts signed and on chart for transport. Spoke with RN and made her aware of d/c timing, [facility] called for 1:30pm.    Final next level of care: Skilled Nursing Facility Barriers to Discharge: Continued Medical Work up   Patient Goals and CMS Choice Patient states their goals for this hospitalization and ongoing recovery are:: get stronger to return to memory care CMS Medicare.gov Compare Post Acute Care list provided to:: Patient Represenative (must comment) (pt daughter) Choice offered to / list presented to : Adult Children  Discharge Placement PASRR number recieved: 06/11/20            Patient chooses bed at: [facility] Patient to be transferred to facility by: [facility] Name of family member notified: pt daughter [name] Patient and family notified of of transfer: 06/13/20  Discharge Plan and Services In-house Referral: Clinical Social Work Discharge Planning Services: CM Consult Post Acute Care Choice: Skilled Nursing Facility           Readmission Risk Interventions Readmission Risk Prevention Plan 06/10/2020  Post Dischage Appt Not Complete  Appt Comments facility resident  Medication Screening Complete  Transportation Screening Complete  PCP or Specialist Appt within 5-7 Days Not Complete  Not Complete comments SNFd/c  Home Care Screening Complete  Medication Review (RN CM) Referral to Pharmacy  Some recent data might be hidden

## 2022-02-05 IMAGING — DX DG CHEST 1V PORT
1 series · 1 of 1 positions shown · non-contrast
Comparison: PA and lateral chest 05/15/2020.

CLINICAL DATA: The patient suffered a left hip fracture in a fall
today. Preoperative exam.

EXAM:
PORTABLE CHEST 1 VIEW

[chest ap]
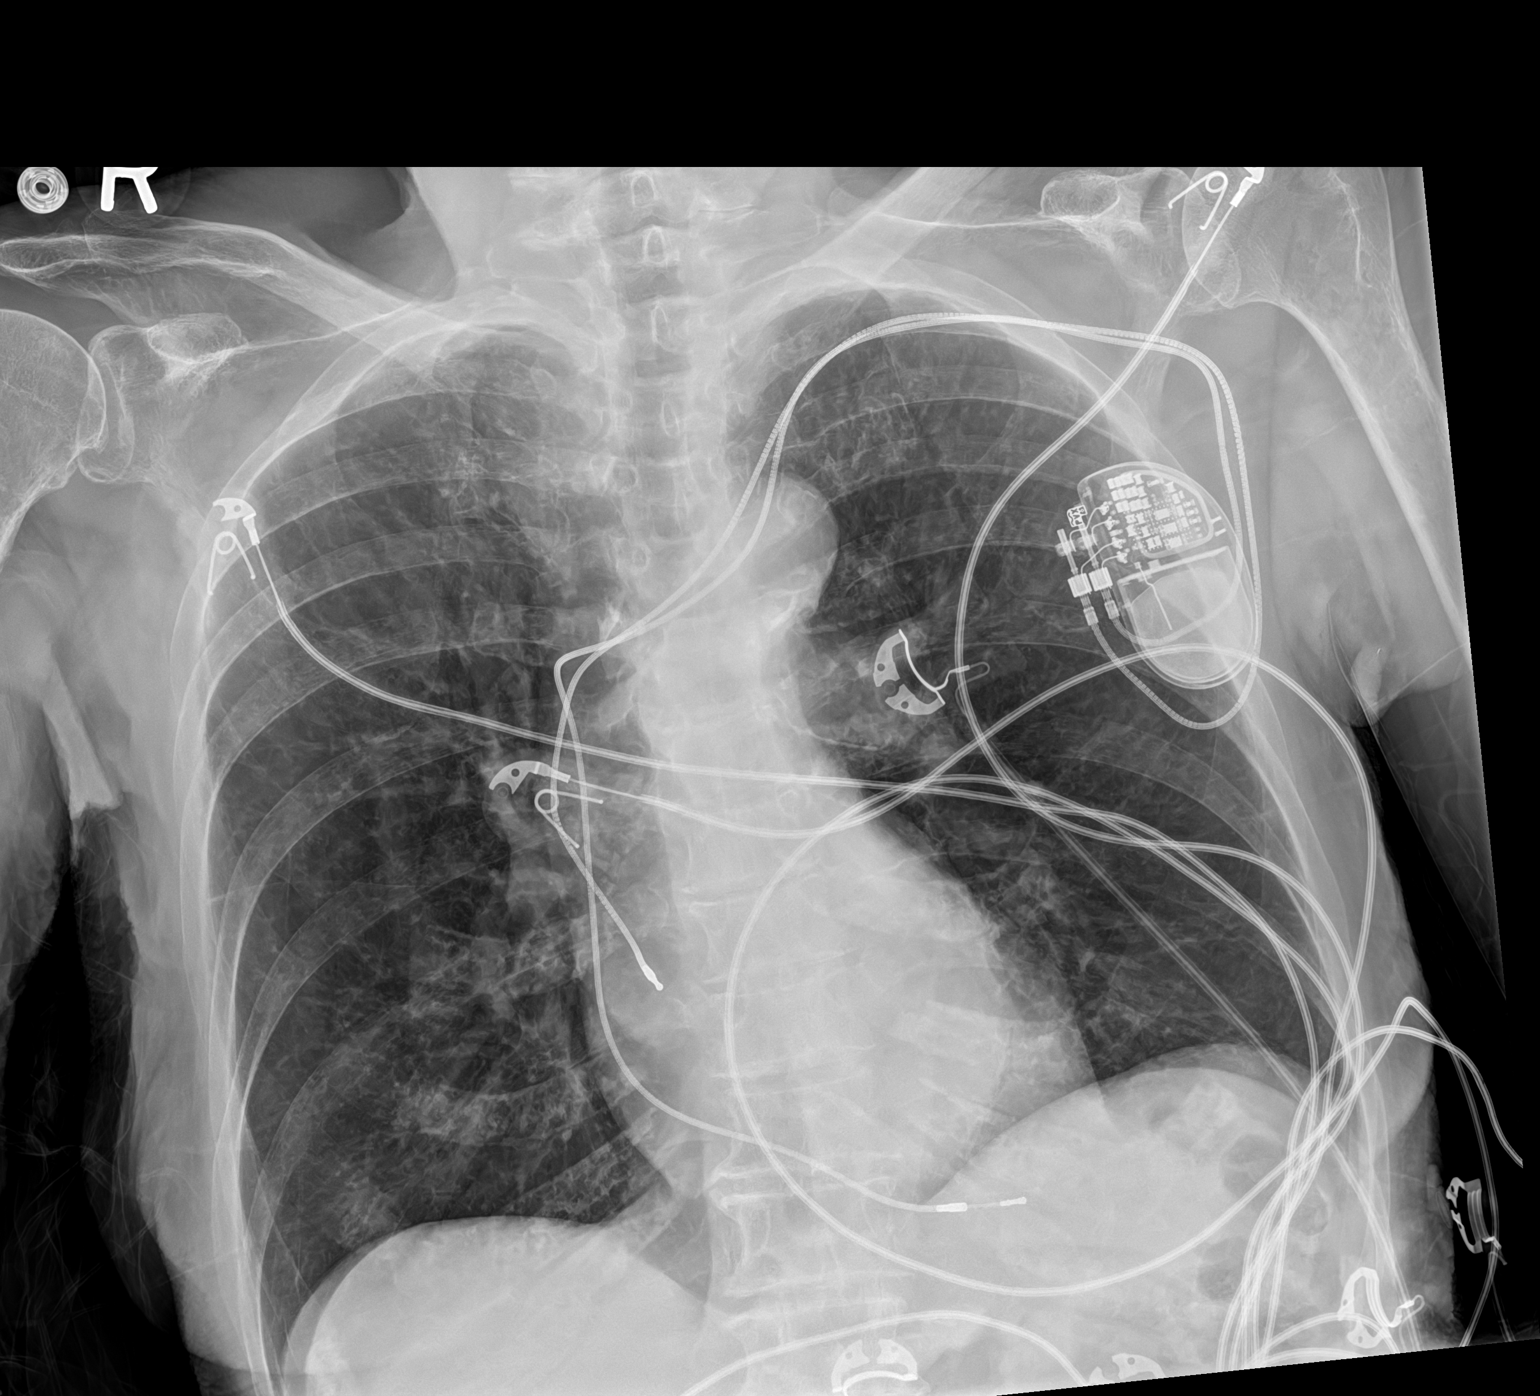

[1 of 1 positions shown; findings below may reference images not displayed]

FINDINGS: The lungs are hyperexpanded but clear. Heart size is normal. Aortic
atherosclerosis. No pneumothorax or pleural effusion. Pacing device
is in place.
IMPRESSION: No acute disease.

Aortic Atherosclerosis (FY64T-ZT3.3) and Emphysema (FY64T-Z2M.Q).

## 2022-02-05 IMAGING — DX DG KNEE COMPLETE 4+V*L*
4 series · 4 of 4 positions shown · non-contrast
Comparison: None.

CLINICAL DATA: Pain. Patient with dementia. Unknown trauma history.

EXAM:
LEFT KNEE - COMPLETE 4+ VIEW

[knee ap]
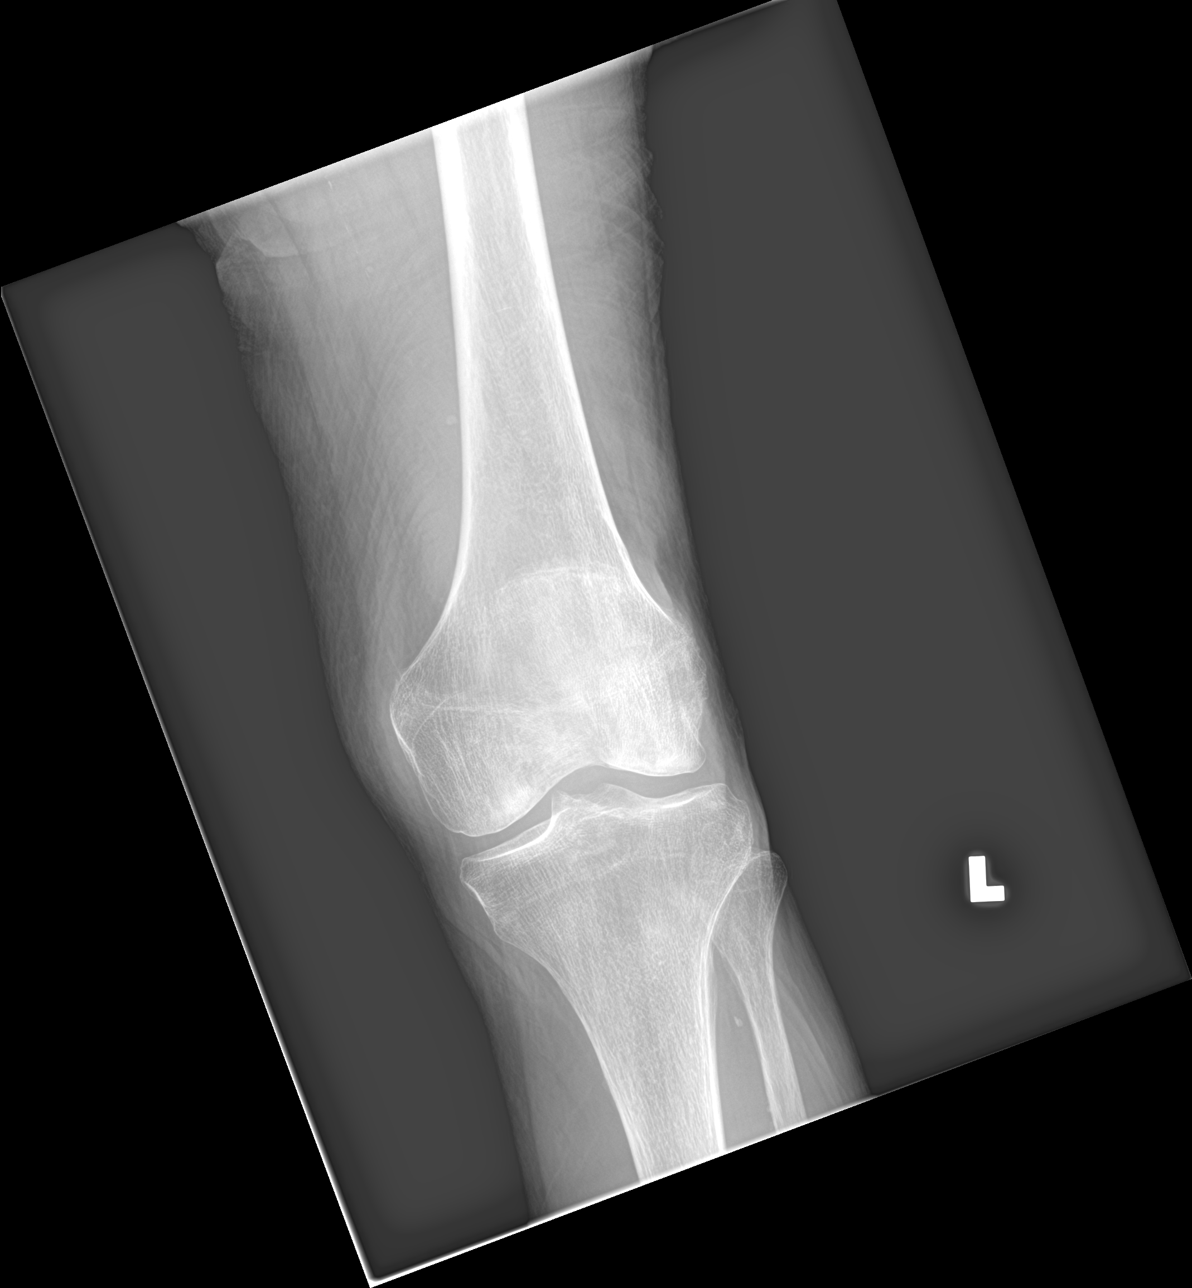

[knee obl (1 of 2)]
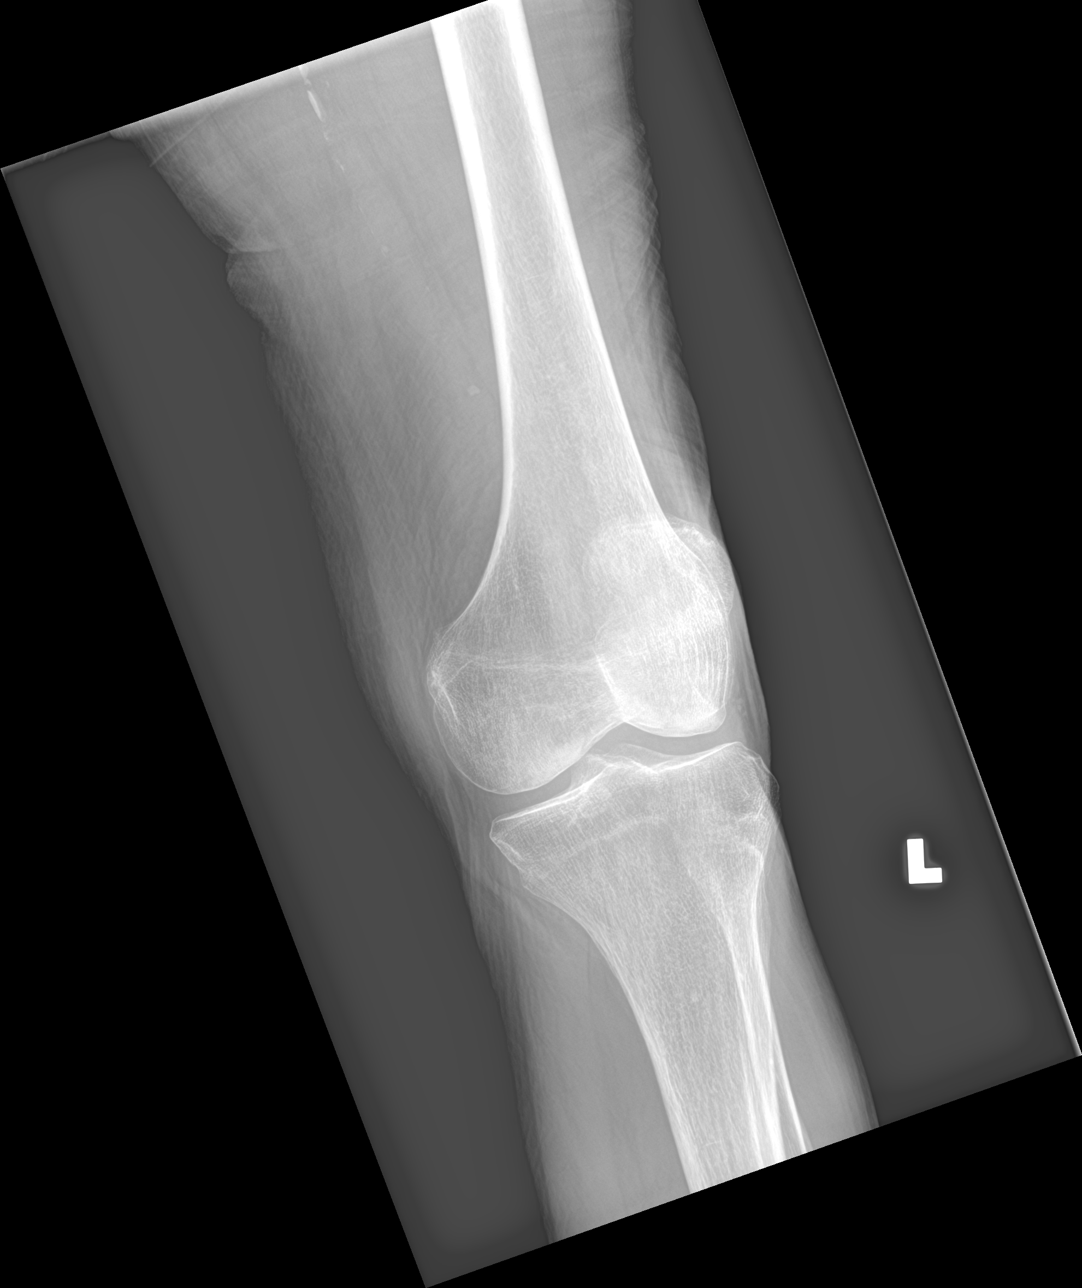

[knee obl (2 of 2)]
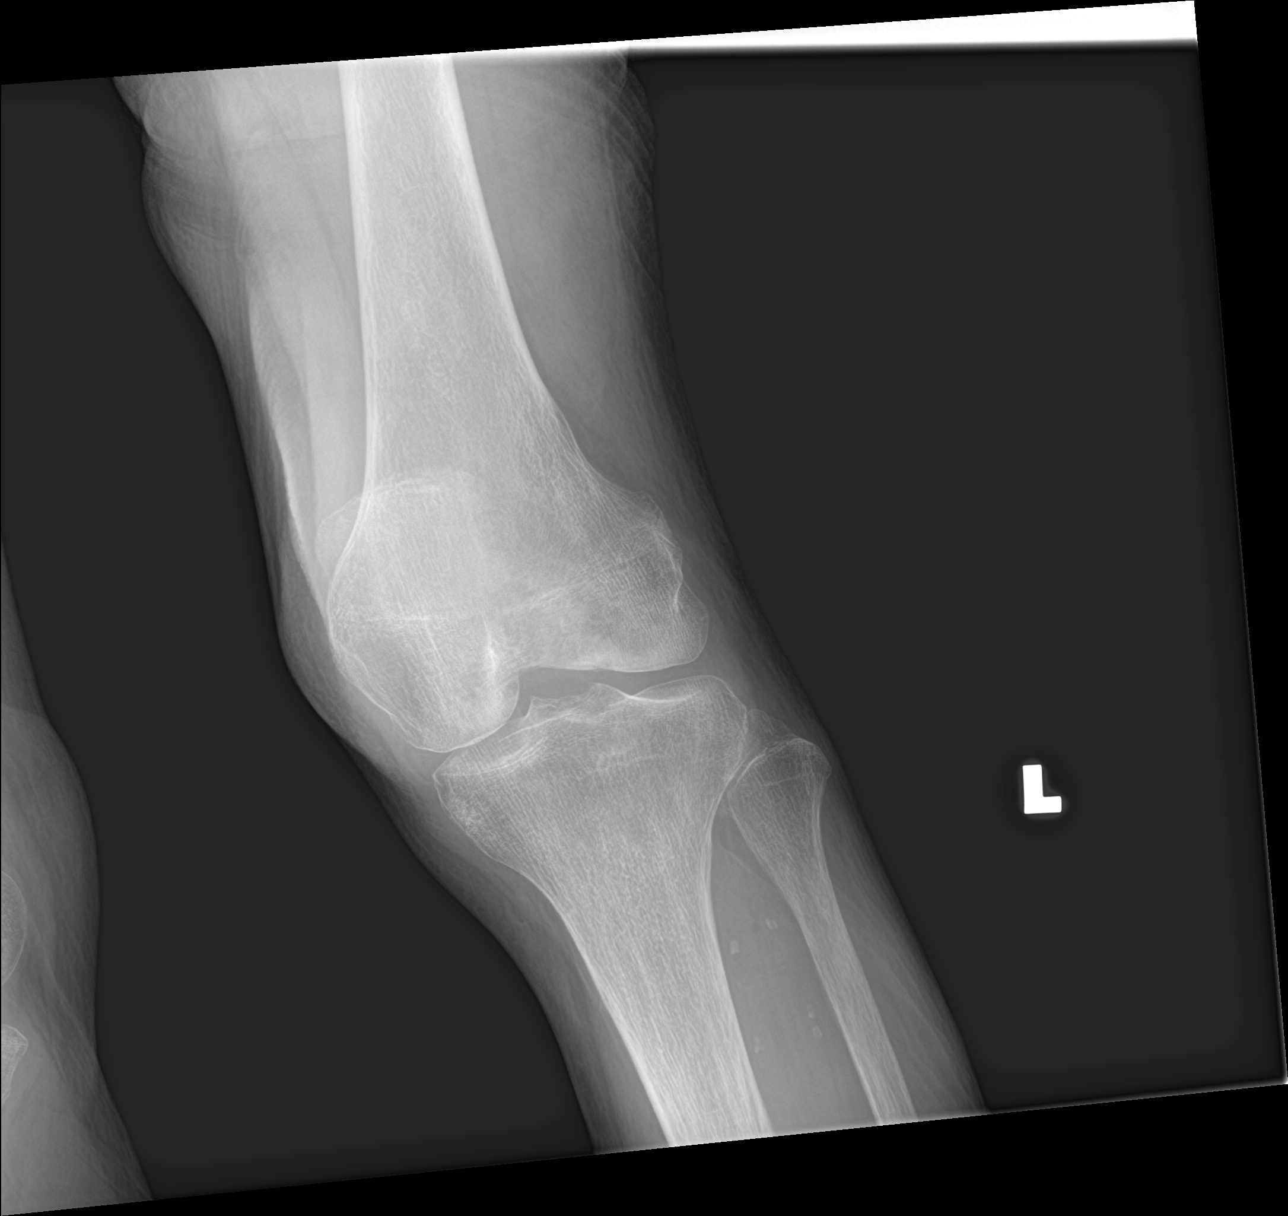

[knee lat]
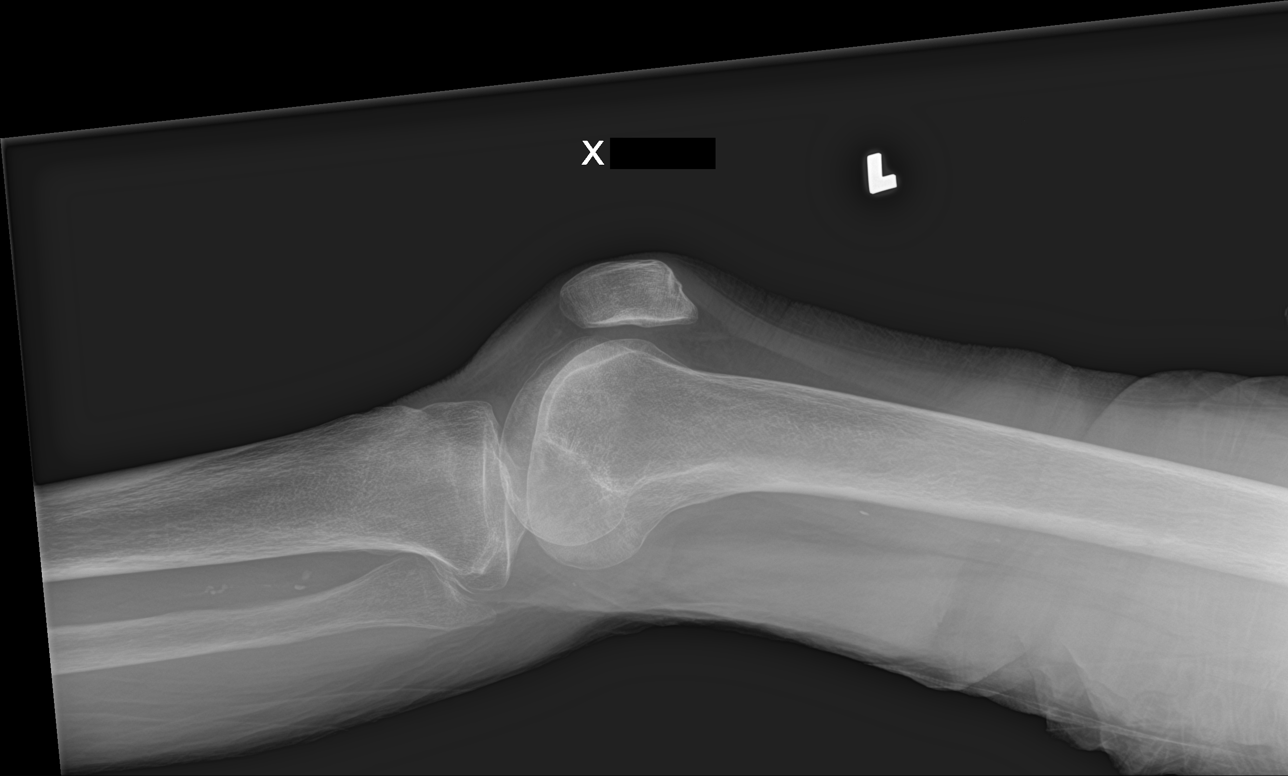

[4 of 4 positions shown; findings below may reference images not displayed]

FINDINGS: No acute fracture or dislocation. No joint effusion. Mild
osteopenia. Joint spaces are maintained for age. Mild degenerate
sclerosis involves the femoral condyles.
IMPRESSION: No acute osseous abnormality.

## 2022-11-22 DEATH — deceased
